# Patient Record
Sex: Male | Born: 1935 | Race: White | Hispanic: No | Smoking: Former smoker
Health system: Southern US, Community
[De-identification: ages and names within clinical notes are randomized; demographics above are authoritative.]

## PROBLEM LIST (undated history)

## (undated) DIAGNOSIS — N183 Chronic kidney disease, stage 3 unspecified: Secondary | ICD-10-CM

## (undated) DIAGNOSIS — M1990 Unspecified osteoarthritis, unspecified site: Secondary | ICD-10-CM

## (undated) DIAGNOSIS — I10 Essential (primary) hypertension: Secondary | ICD-10-CM

## (undated) DIAGNOSIS — N200 Calculus of kidney: Secondary | ICD-10-CM

## (undated) DIAGNOSIS — G309 Alzheimer's disease, unspecified: Secondary | ICD-10-CM

## (undated) DIAGNOSIS — N390 Urinary tract infection, site not specified: Secondary | ICD-10-CM

## (undated) DIAGNOSIS — F0390 Unspecified dementia without behavioral disturbance: Secondary | ICD-10-CM

## (undated) DIAGNOSIS — F0280 Dementia in other diseases classified elsewhere without behavioral disturbance: Secondary | ICD-10-CM

## (undated) DIAGNOSIS — K219 Gastro-esophageal reflux disease without esophagitis: Secondary | ICD-10-CM

## (undated) DIAGNOSIS — E785 Hyperlipidemia, unspecified: Secondary | ICD-10-CM

---

## 1997-10-24 ENCOUNTER — Other Ambulatory Visit: Admission: RE | Admit: 1997-10-24 | Discharge: 1997-10-24 | Payer: Self-pay | Admitting: *Deleted

## 1997-11-18 ENCOUNTER — Other Ambulatory Visit: Admission: RE | Admit: 1997-11-18 | Discharge: 1997-11-18 | Payer: Self-pay | Admitting: Urology

## 1999-01-16 ENCOUNTER — Other Ambulatory Visit: Admission: RE | Admit: 1999-01-16 | Discharge: 1999-01-16 | Payer: Self-pay | Admitting: Gastroenterology

## 1999-01-16 ENCOUNTER — Encounter (INDEPENDENT_AMBULATORY_CARE_PROVIDER_SITE_OTHER): Payer: Self-pay | Admitting: Specialist

## 1999-10-29 ENCOUNTER — Ambulatory Visit (HOSPITAL_COMMUNITY): Admission: RE | Admit: 1999-10-29 | Discharge: 1999-10-29 | Payer: Self-pay | Admitting: Cardiology

## 1999-11-06 ENCOUNTER — Ambulatory Visit (HOSPITAL_COMMUNITY): Admission: RE | Admit: 1999-11-06 | Discharge: 1999-11-06 | Payer: Self-pay | Admitting: Urology

## 1999-11-06 ENCOUNTER — Encounter: Payer: Self-pay | Admitting: Urology

## 1999-11-06 ENCOUNTER — Encounter (INDEPENDENT_AMBULATORY_CARE_PROVIDER_SITE_OTHER): Payer: Self-pay | Admitting: Specialist

## 1999-11-27 ENCOUNTER — Emergency Department (HOSPITAL_COMMUNITY): Admission: EM | Admit: 1999-11-27 | Discharge: 1999-11-27 | Payer: Self-pay

## 2001-02-16 ENCOUNTER — Encounter: Admission: RE | Admit: 2001-02-16 | Discharge: 2001-02-23 | Payer: Self-pay | Admitting: Internal Medicine

## 2002-02-05 ENCOUNTER — Encounter: Payer: Self-pay | Admitting: Urology

## 2002-02-05 ENCOUNTER — Ambulatory Visit (HOSPITAL_BASED_OUTPATIENT_CLINIC_OR_DEPARTMENT_OTHER): Admission: RE | Admit: 2002-02-05 | Discharge: 2002-02-05 | Payer: Self-pay | Admitting: Urology

## 2002-11-28 ENCOUNTER — Encounter: Admission: RE | Admit: 2002-11-28 | Discharge: 2002-11-28 | Payer: Self-pay | Admitting: Urology

## 2002-11-28 ENCOUNTER — Encounter: Payer: Self-pay | Admitting: Urology

## 2002-12-03 ENCOUNTER — Ambulatory Visit (HOSPITAL_BASED_OUTPATIENT_CLINIC_OR_DEPARTMENT_OTHER): Admission: RE | Admit: 2002-12-03 | Discharge: 2002-12-03 | Payer: Self-pay | Admitting: Urology

## 2003-08-15 ENCOUNTER — Encounter (INDEPENDENT_AMBULATORY_CARE_PROVIDER_SITE_OTHER): Payer: Self-pay | Admitting: *Deleted

## 2003-08-23 ENCOUNTER — Ambulatory Visit (HOSPITAL_BASED_OUTPATIENT_CLINIC_OR_DEPARTMENT_OTHER): Admission: RE | Admit: 2003-08-23 | Discharge: 2003-08-23 | Payer: Self-pay | Admitting: Urology

## 2004-01-22 ENCOUNTER — Encounter: Admission: RE | Admit: 2004-01-22 | Discharge: 2004-01-22 | Payer: Self-pay | Admitting: Internal Medicine

## 2004-01-28 ENCOUNTER — Encounter: Admission: RE | Admit: 2004-01-28 | Discharge: 2004-03-02 | Payer: Self-pay | Admitting: Internal Medicine

## 2004-06-19 ENCOUNTER — Ambulatory Visit (HOSPITAL_COMMUNITY): Admission: RE | Admit: 2004-06-19 | Discharge: 2004-06-19 | Payer: Self-pay | Admitting: Orthopedic Surgery

## 2004-06-23 ENCOUNTER — Ambulatory Visit: Payer: Self-pay | Admitting: Internal Medicine

## 2004-08-07 ENCOUNTER — Ambulatory Visit: Payer: Self-pay | Admitting: Family Medicine

## 2004-08-28 ENCOUNTER — Ambulatory Visit: Payer: Self-pay | Admitting: Internal Medicine

## 2004-08-29 ENCOUNTER — Inpatient Hospital Stay (HOSPITAL_COMMUNITY): Admission: EM | Admit: 2004-08-29 | Discharge: 2004-08-31 | Payer: Self-pay | Admitting: *Deleted

## 2004-09-03 ENCOUNTER — Ambulatory Visit: Payer: Self-pay | Admitting: Internal Medicine

## 2004-09-08 ENCOUNTER — Ambulatory Visit: Payer: Self-pay | Admitting: Internal Medicine

## 2004-09-25 ENCOUNTER — Ambulatory Visit: Payer: Self-pay | Admitting: Internal Medicine

## 2004-10-09 ENCOUNTER — Ambulatory Visit: Payer: Self-pay | Admitting: Internal Medicine

## 2004-12-10 ENCOUNTER — Ambulatory Visit: Payer: Self-pay | Admitting: Internal Medicine

## 2004-12-10 ENCOUNTER — Ambulatory Visit (HOSPITAL_COMMUNITY): Admission: RE | Admit: 2004-12-10 | Discharge: 2004-12-10 | Payer: Self-pay | Admitting: Internal Medicine

## 2004-12-15 ENCOUNTER — Ambulatory Visit: Payer: Self-pay | Admitting: Internal Medicine

## 2004-12-28 ENCOUNTER — Ambulatory Visit: Payer: Self-pay | Admitting: Internal Medicine

## 2005-01-05 ENCOUNTER — Encounter: Admission: RE | Admit: 2005-01-05 | Discharge: 2005-01-05 | Payer: Self-pay | Admitting: Internal Medicine

## 2005-01-29 ENCOUNTER — Ambulatory Visit: Payer: Self-pay | Admitting: Internal Medicine

## 2005-02-09 ENCOUNTER — Ambulatory Visit: Payer: Self-pay | Admitting: Internal Medicine

## 2005-10-29 ENCOUNTER — Ambulatory Visit: Payer: Self-pay | Admitting: Gastroenterology

## 2005-11-09 ENCOUNTER — Ambulatory Visit: Payer: Self-pay | Admitting: Gastroenterology

## 2007-07-06 ENCOUNTER — Observation Stay (HOSPITAL_COMMUNITY): Admission: EM | Admit: 2007-07-06 | Discharge: 2007-07-08 | Payer: Self-pay | Admitting: *Deleted

## 2008-06-02 ENCOUNTER — Inpatient Hospital Stay (HOSPITAL_COMMUNITY): Admission: EM | Admit: 2008-06-02 | Discharge: 2008-06-04 | Payer: Self-pay | Admitting: Internal Medicine

## 2008-06-02 ENCOUNTER — Ambulatory Visit: Payer: Self-pay | Admitting: Internal Medicine

## 2008-06-02 ENCOUNTER — Encounter: Payer: Self-pay | Admitting: Emergency Medicine

## 2008-11-03 ENCOUNTER — Emergency Department (HOSPITAL_BASED_OUTPATIENT_CLINIC_OR_DEPARTMENT_OTHER): Admission: EM | Admit: 2008-11-03 | Discharge: 2008-11-03 | Payer: Self-pay | Admitting: Emergency Medicine

## 2009-02-06 ENCOUNTER — Inpatient Hospital Stay (HOSPITAL_COMMUNITY): Admission: EM | Admit: 2009-02-06 | Discharge: 2009-02-09 | Payer: Self-pay | Admitting: Emergency Medicine

## 2009-03-06 ENCOUNTER — Ambulatory Visit (HOSPITAL_COMMUNITY): Admission: RE | Admit: 2009-03-06 | Discharge: 2009-03-06 | Payer: Self-pay | Admitting: Gastroenterology

## 2009-08-08 ENCOUNTER — Emergency Department (HOSPITAL_BASED_OUTPATIENT_CLINIC_OR_DEPARTMENT_OTHER): Admission: EM | Admit: 2009-08-08 | Discharge: 2009-08-08 | Payer: Self-pay | Admitting: Emergency Medicine

## 2009-09-04 ENCOUNTER — Emergency Department (HOSPITAL_BASED_OUTPATIENT_CLINIC_OR_DEPARTMENT_OTHER): Admission: EM | Admit: 2009-09-04 | Discharge: 2009-09-04 | Payer: Self-pay | Admitting: Emergency Medicine

## 2009-09-04 ENCOUNTER — Ambulatory Visit: Payer: Self-pay | Admitting: Diagnostic Radiology

## 2009-09-05 ENCOUNTER — Inpatient Hospital Stay (HOSPITAL_COMMUNITY): Admission: EM | Admit: 2009-09-05 | Discharge: 2009-09-10 | Payer: Self-pay | Admitting: Emergency Medicine

## 2009-09-22 ENCOUNTER — Inpatient Hospital Stay (HOSPITAL_COMMUNITY): Admission: EM | Admit: 2009-09-22 | Discharge: 2009-09-29 | Payer: Self-pay | Admitting: Emergency Medicine

## 2009-09-22 ENCOUNTER — Ambulatory Visit (HOSPITAL_COMMUNITY): Admission: RE | Admit: 2009-09-22 | Discharge: 2009-09-22 | Payer: Self-pay | Admitting: Urology

## 2009-09-29 ENCOUNTER — Ambulatory Visit: Payer: Self-pay | Admitting: Physical Medicine & Rehabilitation

## 2010-08-09 ENCOUNTER — Encounter: Payer: Self-pay | Admitting: Internal Medicine

## 2010-09-05 ENCOUNTER — Telehealth: Payer: Self-pay | Admitting: Internal Medicine

## 2010-09-07 ENCOUNTER — Emergency Department (HOSPITAL_BASED_OUTPATIENT_CLINIC_OR_DEPARTMENT_OTHER): Payer: MEDICARE

## 2010-09-07 ENCOUNTER — Emergency Department (HOSPITAL_BASED_OUTPATIENT_CLINIC_OR_DEPARTMENT_OTHER)
Admission: EM | Admit: 2010-09-07 | Discharge: 2010-09-07 | Disposition: A | Discharge status: Home / Self Care | Payer: MEDICARE | Attending: Emergency Medicine | Admitting: Emergency Medicine

## 2010-09-07 DIAGNOSIS — R5383 Other fatigue: Secondary | ICD-10-CM

## 2010-09-07 DIAGNOSIS — R5381 Other malaise: Secondary | ICD-10-CM

## 2010-09-07 DIAGNOSIS — G319 Degenerative disease of nervous system, unspecified: Secondary | ICD-10-CM | POA: Insufficient documentation

## 2010-09-07 DIAGNOSIS — E039 Hypothyroidism, unspecified: Secondary | ICD-10-CM | POA: Insufficient documentation

## 2010-09-07 DIAGNOSIS — Z79899 Other long term (current) drug therapy: Secondary | ICD-10-CM | POA: Insufficient documentation

## 2010-09-07 DIAGNOSIS — N390 Urinary tract infection, site not specified: Secondary | ICD-10-CM | POA: Insufficient documentation

## 2010-09-07 DIAGNOSIS — K219 Gastro-esophageal reflux disease without esophagitis: Secondary | ICD-10-CM | POA: Insufficient documentation

## 2010-09-07 DIAGNOSIS — R4789 Other speech disturbances: Secondary | ICD-10-CM

## 2010-09-07 DIAGNOSIS — I10 Essential (primary) hypertension: Secondary | ICD-10-CM | POA: Insufficient documentation

## 2010-09-07 LAB — LACTIC ACID, PLASMA: Lactic Acid, Venous: 1.2 mmol/L (ref 0.5–2.2)

## 2010-09-07 LAB — DIFFERENTIAL
Basophils Relative: 0 % (ref 0–1)
Lymphocytes Relative: 17 % (ref 12–46)
Lymphs Abs: 1.4 10*3/uL (ref 0.7–4.0)
Monocytes Absolute: 0.7 10*3/uL (ref 0.1–1.0)
Monocytes Relative: 9 % (ref 3–12)
Neutro Abs: 5.9 10*3/uL (ref 1.7–7.7)
Neutrophils Relative %: 73 % (ref 43–77)

## 2010-09-07 LAB — COMPREHENSIVE METABOLIC PANEL
AST: 35 U/L (ref 0–37)
Albumin: 4.1 g/dL (ref 3.5–5.2)
Alkaline Phosphatase: 76 U/L (ref 39–117)
BUN: 22 mg/dL (ref 6–23)
Creatinine, Ser: 1.5 mg/dL (ref 0.4–1.5)
GFR calc Af Amer: 54 mL/min — ABNORMAL LOW (ref >60)
Potassium: 3.3 mEq/L — ABNORMAL LOW (ref 3.5–5.1)
Total Protein: 7.3 g/dL (ref 6.0–8.3)

## 2010-09-07 LAB — URINE MICROSCOPIC-ADD ON

## 2010-09-07 LAB — URINALYSIS, ROUTINE W REFLEX MICROSCOPIC
Bilirubin Urine: NEGATIVE
Ketones, ur: NEGATIVE mg/dL
Nitrite: POSITIVE — AB
Protein, ur: 30 mg/dL — AB
Urobilinogen, UA: 0.2 mg/dL (ref 0.0–1.0)

## 2010-09-07 LAB — CBC
HCT: 43.1 % (ref 39.0–52.0)
Hemoglobin: 15.3 g/dL (ref 13.0–17.0)
MCH: 32 pg (ref 26.0–34.0)
MCHC: 35.5 g/dL (ref 30.0–36.0)
RBC: 4.78 MIL/uL (ref 4.22–5.81)

## 2010-09-07 LAB — TSH: TSH: 3.399 u[IU]/mL (ref 0.350–4.500)

## 2010-09-07 LAB — PROTIME-INR
INR: 1.03 (ref 0.00–1.49)
Prothrombin Time: 13.7 seconds (ref 11.6–15.2)

## 2010-09-07 LAB — POCT CARDIAC MARKERS
CKMB, poc: 2.5 ng/mL (ref 1.0–8.0)
Myoglobin, poc: 174 ng/mL (ref 12–200)
Troponin i, poc: <0.05 ng/mL (ref 0.00–0.09)

## 2010-09-10 LAB — URINE CULTURE

## 2010-09-14 LAB — CULTURE, BLOOD (ROUTINE X 2)
Culture  Setup Time: 201202210018
Culture: NO GROWTH

## 2010-09-15 NOTE — Procedures (Signed)
Summary: Colonoscopy- Dr Corinda Gubler    Colonoscopy  Procedure date:  08/15/2003  Findings:      Location:  [facility].    Patient Name: [name], [name] MRN:  Procedure Procedures: Colonoscopy CPT: 11914.  Personnel: Endoscopist: Ulyess Mort, MD.  Exam Location: Exam performed in Outpatient Clinic. Outpatient  Patient Consent: Procedure, Alternatives, Risks and Benefits discussed, consent obtained, from patient. Consent was obtained by the RN.  Indications  Surveillance of: Adenomatous Polyp(s).  History  Current Medications: Patient is not currently taking Coumadin.  Pre-Exam Physical: Entire physical exam was normal.  Exam Exam: Extent of exam reached: Cecum, extent intended: Cecum.  The cecum was identified by appendiceal orifice and IC valve. Colon retroflexion performed. Images were not taken. ASA Classification: II. Tolerance: good.  Monitoring: Pulse and BP monitoring, Oximetry used. Supplemental O2 given.  Colon Prep Prep results: good.  Sedation Meds: Patient assessed and found to be appropriate for moderate (conscious) sedation. Fentanyl 75 mcg. given IV. Versed 7.5 mg. given IV.  Findings - DIVERTICULOSIS: Transverse Colon to Sigmoid Colon. ICD9: Diverticulosis: 562.10. Comments: mild.  POLYP: Rectum, Maximum size: 6 mm. sessile polyp. Procedure:  snare with cautery, removed, retrieved, Polyp sent to pathology. ICD9: Colon Polyps: 211.3.   Assessment Abnormal examination, see findings above.  Diagnoses: 211.3: Colon Polyps.  562.10: Diverticulosis.   Events  Unplanned Interventions: No intervention was required.  Unplanned Events: There were no complications. Plans Medication Plan: Await pathology. Continue current medications.  Patient Education: Patient given standard instructions for: Polyps. Diverticulosis. Yearly hemoccult testing recommended. Patient instructed to get routine colonoscopy every 4 years.    Disposition: After procedure patient sent to recovery. After recovery patient sent home.    This report was created from the original endoscopy report, which was reviewed and signed by the above listed endoscopist.

## 2010-09-15 NOTE — Progress Notes (Signed)
Summary: ? repeat colonoscopy   Phone Note Other Incoming   Caller: Dr. Barbaraann Barthel [phone] Summary of Call: She left a message ? if he needs a repeat colonoscopy at age 77 - seems unlikely but please pull chart (paper) Iva Boop MD, [facility]  September 05, 2010 10:20 AM   Follow-up for Phone Call        this is Dr Lamar Sprinkles patient.  According to [facility] he was due for a colon in 07-2007.  I have copied and pasted in his only Cori* report.  The Path report reads "Rectum bx: Skeletal muscle fragment consistent with food matter,  No Colonic tissue identified."  I have sent it down to be stat scanned. Follow-up by: Darcey Nora RN, CGRN,  September 07, 2010 9:45 AM  Additional Follow-up for Phone Call Additional follow up Details #1::        Discussed with Dr Marina Goodell he has actually spoke with [facility] [employer] today on this no recall needed.  Additional Follow-up by: Darcey Nora RN, CGRN,  September 07, 2010 9:47 AM

## 2010-10-04 LAB — COMPREHENSIVE METABOLIC PANEL
Albumin: 4.1 g/dL (ref 3.5–5.2)
BUN: 21 mg/dL (ref 6–23)
Chloride: 101 mEq/L (ref 96–112)
Creatinine, Ser: 1.6 mg/dL — ABNORMAL HIGH (ref 0.4–1.5)
GFR calc non Af Amer: 41 mL/min — ABNORMAL LOW (ref >60)
Total Bilirubin: 0.7 mg/dL (ref 0.3–1.2)

## 2010-10-04 LAB — CBC
HCT: 42.5 % (ref 39.0–52.0)
MCV: 91.3 fL (ref 78.0–100.0)
Platelets: 204 10*3/uL (ref 150–400)
WBC: 11.3 10*3/uL — ABNORMAL HIGH (ref 4.0–10.5)

## 2010-10-04 LAB — DIFFERENTIAL
Basophils Absolute: 0.2 10*3/uL — ABNORMAL HIGH (ref 0.0–0.1)
Lymphocytes Relative: 9 % — ABNORMAL LOW (ref 12–46)
Monocytes Absolute: 0.7 10*3/uL (ref 0.1–1.0)
Neutro Abs: 9.3 10*3/uL — ABNORMAL HIGH (ref 1.7–7.7)

## 2010-10-04 LAB — URINALYSIS, ROUTINE W REFLEX MICROSCOPIC
Bilirubin Urine: NEGATIVE
Glucose, UA: NEGATIVE mg/dL
Nitrite: NEGATIVE
Specific Gravity, Urine: 1.02 (ref 1.005–1.030)
pH: 5.5 (ref 5.0–8.0)

## 2010-10-04 LAB — URINE MICROSCOPIC-ADD ON

## 2010-10-04 LAB — URINE CULTURE

## 2010-10-07 LAB — CBC
Hemoglobin: 11.3 g/dL — ABNORMAL LOW (ref 13.0–17.0)
Hemoglobin: 11.8 g/dL — ABNORMAL LOW (ref 13.0–17.0)
MCHC: 34.8 g/dL (ref 30.0–36.0)
MCHC: 35 g/dL (ref 30.0–36.0)
MCHC: 35.1 g/dL (ref 30.0–36.0)
MCHC: 35.4 g/dL (ref 30.0–36.0)
MCV: 90.3 fL (ref 78.0–100.0)
MCV: 90.8 fL (ref 78.0–100.0)
Platelets: 157 10*3/uL (ref 150–400)
Platelets: 178 10*3/uL (ref 150–400)
Platelets: 213 10*3/uL (ref 150–400)
RBC: 3.45 MIL/uL — ABNORMAL LOW (ref 4.22–5.81)
RBC: 3.61 MIL/uL — ABNORMAL LOW (ref 4.22–5.81)
RDW: 13.7 % (ref 11.5–15.5)
RDW: 13.8 % (ref 11.5–15.5)
WBC: 5.2 10*3/uL (ref 4.0–10.5)
WBC: 7.2 10*3/uL (ref 4.0–10.5)

## 2010-10-07 LAB — BASIC METABOLIC PANEL
BUN: 20 mg/dL (ref 6–23)
CO2: 25 mEq/L (ref 19–32)
Calcium: 8.3 mg/dL — ABNORMAL LOW (ref 8.4–10.5)
Calcium: 8.5 mg/dL (ref 8.4–10.5)
Calcium: 8.6 mg/dL (ref 8.4–10.5)
Chloride: 102 mEq/L (ref 96–112)
Chloride: 103 mEq/L (ref 96–112)
Chloride: 104 mEq/L (ref 96–112)
Chloride: 105 mEq/L (ref 96–112)
Chloride: 105 mEq/L (ref 96–112)
Creatinine, Ser: 1.47 mg/dL (ref 0.4–1.5)
Creatinine, Ser: 1.77 mg/dL — ABNORMAL HIGH (ref 0.4–1.5)
GFR calc Af Amer: 41 mL/min — ABNORMAL LOW (ref >60)
GFR calc Af Amer: 55 mL/min — ABNORMAL LOW (ref >60)
GFR calc Af Amer: >60 mL/min (ref >60)
GFR calc Af Amer: >60 mL/min (ref >60)
Potassium: 2.8 mEq/L — ABNORMAL LOW (ref 3.5–5.1)
Potassium: 2.9 mEq/L — ABNORMAL LOW (ref 3.5–5.1)
Potassium: 3.3 mEq/L — ABNORMAL LOW (ref 3.5–5.1)
Sodium: 133 mEq/L — ABNORMAL LOW (ref 135–145)
Sodium: 135 mEq/L (ref 135–145)
Sodium: 136 mEq/L (ref 135–145)
Sodium: 136 mEq/L (ref 135–145)

## 2010-10-07 LAB — DIFFERENTIAL
Basophils Absolute: 0 10*3/uL (ref 0.0–0.1)
Basophils Relative: 0 % (ref 0–1)
Basophils Relative: 0 % (ref 0–1)
Eosinophils Absolute: 0 10*3/uL (ref 0.0–0.7)
Eosinophils Relative: 0 % (ref 0–5)
Lymphocytes Relative: 1 % — ABNORMAL LOW (ref 12–46)
Lymphs Abs: 0.5 10*3/uL — ABNORMAL LOW (ref 0.7–4.0)
Monocytes Absolute: 0.5 10*3/uL (ref 0.1–1.0)
Monocytes Relative: 6 % (ref 3–12)
Neutro Abs: 11.2 10*3/uL — ABNORMAL HIGH (ref 1.7–7.7)
Neutrophils Relative %: 87 % — ABNORMAL HIGH (ref 43–77)
Neutrophils Relative %: 95 % — ABNORMAL HIGH (ref 43–77)

## 2010-10-07 LAB — CULTURE, BLOOD (ROUTINE X 2): Culture: NO GROWTH

## 2010-10-07 LAB — URINALYSIS, ROUTINE W REFLEX MICROSCOPIC
Bilirubin Urine: NEGATIVE
Nitrite: NEGATIVE
Specific Gravity, Urine: 1.021 (ref 1.005–1.030)
Urobilinogen, UA: 0.2 mg/dL (ref 0.0–1.0)
pH: 5 (ref 5.0–8.0)

## 2010-10-07 LAB — COMPREHENSIVE METABOLIC PANEL
ALT: 19 U/L (ref 0–53)
AST: 24 U/L (ref 0–37)
Albumin: 3.3 g/dL — ABNORMAL LOW (ref 3.5–5.2)
Alkaline Phosphatase: 52 U/L (ref 39–117)
Calcium: 8.7 mg/dL (ref 8.4–10.5)
GFR calc Af Amer: 40 mL/min — ABNORMAL LOW (ref >60)
Potassium: 3.1 mEq/L — ABNORMAL LOW (ref 3.5–5.1)
Sodium: 136 mEq/L (ref 135–145)
Total Protein: 6.1 g/dL (ref 6.0–8.3)

## 2010-10-07 LAB — URINE MICROSCOPIC-ADD ON

## 2010-10-07 LAB — POCT CARDIAC MARKERS
CKMB, poc: <1 ng/mL — ABNORMAL LOW (ref 1.0–8.0)
Myoglobin, poc: 224 ng/mL (ref 12–200)
Troponin i, poc: <0.05 ng/mL (ref 0.00–0.09)

## 2010-10-07 LAB — URINE CULTURE

## 2010-10-07 LAB — CREATININE, URINE, RANDOM: Creatinine, Urine: 158.3 mg/dL

## 2010-10-07 LAB — MAGNESIUM: Magnesium: 1.7 mg/dL (ref 1.5–2.5)

## 2010-10-09 LAB — URINALYSIS, MICROSCOPIC ONLY
Bilirubin Urine: NEGATIVE
Glucose, UA: NEGATIVE mg/dL
Ketones, ur: NEGATIVE mg/dL
Nitrite: NEGATIVE
Protein, ur: 30 mg/dL — AB
Specific Gravity, Urine: 1.015 (ref 1.005–1.030)
Urobilinogen, UA: 1 mg/dL (ref 0.0–1.0)
pH: 6.5 (ref 5.0–8.0)

## 2010-10-09 LAB — CBC
HCT: 33.4 % — ABNORMAL LOW (ref 39.0–52.0)
HCT: 33.6 % — ABNORMAL LOW (ref 39.0–52.0)
Hemoglobin: 10.9 g/dL — ABNORMAL LOW (ref 13.0–17.0)
Hemoglobin: 11.1 g/dL — ABNORMAL LOW (ref 13.0–17.0)
MCHC: 32.8 g/dL (ref 30.0–36.0)
MCHC: 33.1 g/dL (ref 30.0–36.0)
MCV: 90.3 fL (ref 78.0–100.0)
MCV: 91.4 fL (ref 78.0–100.0)
MCV: 91.5 fL (ref 78.0–100.0)
Platelets: 240 10*3/uL (ref 150–400)
Platelets: 276 10*3/uL (ref 150–400)
RBC: 3.67 MIL/uL — ABNORMAL LOW (ref 4.22–5.81)
RBC: 3.7 MIL/uL — ABNORMAL LOW (ref 4.22–5.81)
RBC: 3.78 MIL/uL — ABNORMAL LOW (ref 4.22–5.81)
RDW: 14.3 % (ref 11.5–15.5)
RDW: 14.6 % (ref 11.5–15.5)
WBC: 7 10*3/uL (ref 4.0–10.5)
WBC: 8.4 10*3/uL (ref 4.0–10.5)
WBC: 8.5 10*3/uL (ref 4.0–10.5)

## 2010-10-09 LAB — COMPREHENSIVE METABOLIC PANEL
ALT: 125 U/L — ABNORMAL HIGH (ref 0–53)
ALT: 19 U/L (ref 0–53)
AST: 18 U/L (ref 0–37)
AST: 82 U/L — ABNORMAL HIGH (ref 0–37)
Albumin: 3.2 g/dL — ABNORMAL LOW (ref 3.5–5.2)
Alkaline Phosphatase: 121 U/L — ABNORMAL HIGH (ref 39–117)
Alkaline Phosphatase: 58 U/L (ref 39–117)
BUN: 9 mg/dL (ref 6–23)
CO2: 23 mEq/L (ref 19–32)
CO2: 24 mEq/L (ref 19–32)
Calcium: 8.5 mg/dL (ref 8.4–10.5)
Chloride: 107 mEq/L (ref 96–112)
Chloride: 108 mEq/L (ref 96–112)
Creatinine, Ser: 1.37 mg/dL (ref 0.4–1.5)
GFR calc Af Amer: 60 mL/min — ABNORMAL LOW (ref >60)
GFR calc Af Amer: >60 mL/min (ref >60)
GFR calc non Af Amer: 49 mL/min — ABNORMAL LOW (ref >60)
GFR calc non Af Amer: 53 mL/min — ABNORMAL LOW (ref >60)
Glucose, Bld: 125 mg/dL — ABNORMAL HIGH (ref 70–99)
Potassium: 2.8 mEq/L — ABNORMAL LOW (ref 3.5–5.1)
Potassium: 4 mEq/L (ref 3.5–5.1)
Sodium: 135 mEq/L (ref 135–145)
Sodium: 138 mEq/L (ref 135–145)
Total Bilirubin: 0.9 mg/dL (ref 0.3–1.2)
Total Bilirubin: 1 mg/dL (ref 0.3–1.2)
Total Protein: 6 g/dL (ref 6.0–8.3)

## 2010-10-09 LAB — CARDIAC PANEL(CRET KIN+CKTOT+MB+TROPI)
CK, MB: 1.5 ng/mL (ref 0.3–4.0)
CK, MB: 2.3 ng/mL (ref 0.3–4.0)
Relative Index: INVALID (ref 0.0–2.5)
Total CK: 41 U/L (ref 7–232)
Total CK: 44 U/L (ref 7–232)
Troponin I: 0.02 ng/mL (ref 0.00–0.06)
Troponin I: <0.01 ng/mL (ref 0.00–0.06)

## 2010-10-09 LAB — BASIC METABOLIC PANEL
BUN: 10 mg/dL (ref 6–23)
BUN: 8 mg/dL (ref 6–23)
CO2: 25 mEq/L (ref 19–32)
CO2: 28 mEq/L (ref 19–32)
Calcium: 8.7 mg/dL (ref 8.4–10.5)
Calcium: 8.7 mg/dL (ref 8.4–10.5)
Chloride: 106 mEq/L (ref 96–112)
Chloride: 109 mEq/L (ref 96–112)
Creatinine, Ser: 1.25 mg/dL (ref 0.4–1.5)
Creatinine, Ser: 1.37 mg/dL (ref 0.4–1.5)
GFR calc Af Amer: 60 mL/min — ABNORMAL LOW (ref >60)
GFR calc Af Amer: >60 mL/min (ref >60)
GFR calc non Af Amer: 49 mL/min — ABNORMAL LOW (ref >60)
GFR calc non Af Amer: 55 mL/min — ABNORMAL LOW (ref >60)
Glucose, Bld: 126 mg/dL — ABNORMAL HIGH (ref 70–99)
Glucose, Bld: 159 mg/dL — ABNORMAL HIGH (ref 70–99)
Potassium: 2.9 mEq/L — ABNORMAL LOW (ref 3.5–5.1)
Potassium: 3.9 mEq/L (ref 3.5–5.1)
Sodium: 135 mEq/L (ref 135–145)
Sodium: 139 mEq/L (ref 135–145)

## 2010-10-09 LAB — CULTURE, BLOOD (ROUTINE X 2)
Culture: NO GROWTH
Culture: NO GROWTH

## 2010-10-09 LAB — URINE CULTURE
Colony Count: NO GROWTH
Colony Count: NO GROWTH
Culture: NO GROWTH
Culture: NO GROWTH
Special Requests: NEGATIVE
Special Requests: NEGATIVE

## 2010-10-09 LAB — T3, FREE: T3, Free: 2.3 pg/mL (ref 2.3–4.2)

## 2010-10-09 LAB — HEPATIC FUNCTION PANEL
ALT: 79 U/L — ABNORMAL HIGH (ref 0–53)
AST: 28 U/L (ref 0–37)
Bilirubin, Direct: 0.1 mg/dL (ref 0.0–0.3)
Indirect Bilirubin: 0.6 mg/dL (ref 0.3–0.9)
Total Protein: 6.2 g/dL (ref 6.0–8.3)

## 2010-10-09 LAB — DIFFERENTIAL
Basophils Absolute: 0 10*3/uL (ref 0.0–0.1)
Eosinophils Absolute: 0 10*3/uL (ref 0.0–0.7)
Eosinophils Relative: 0 % (ref 0–5)
Monocytes Absolute: 0.6 10*3/uL (ref 0.1–1.0)

## 2010-10-09 LAB — CULTURE, BLOOD (SINGLE): Culture: NO GROWTH

## 2010-10-09 LAB — BRAIN NATRIURETIC PEPTIDE: Pro B Natriuretic peptide (BNP): 48.9 pg/mL (ref 0.0–100.0)

## 2010-10-09 LAB — MAGNESIUM: Magnesium: 2.1 mg/dL (ref 1.5–2.5)

## 2010-10-25 LAB — CBC
HCT: 42.7 % (ref 39.0–52.0)
Hemoglobin: 14.7 g/dL (ref 13.0–17.0)
Hemoglobin: 16.2 g/dL (ref 13.0–17.0)
MCHC: 35.1 g/dL (ref 30.0–36.0)
MCV: 91.5 fL (ref 78.0–100.0)
RBC: 5.15 MIL/uL (ref 4.22–5.81)
RDW: 13.7 % (ref 11.5–15.5)
WBC: 5.9 10*3/uL (ref 4.0–10.5)
WBC: 8.1 10*3/uL (ref 4.0–10.5)

## 2010-10-25 LAB — DIFFERENTIAL
Basophils Absolute: 0 10*3/uL (ref 0.0–0.1)
Eosinophils Absolute: 0.1 10*3/uL (ref 0.0–0.7)
Eosinophils Relative: 2 % (ref 0–5)
Lymphocytes Relative: 19 % (ref 12–46)
Monocytes Absolute: 1 10*3/uL (ref 0.1–1.0)

## 2010-10-25 LAB — BASIC METABOLIC PANEL
BUN: 14 mg/dL (ref 6–23)
CO2: 21 mEq/L (ref 19–32)
Calcium: 9 mg/dL (ref 8.4–10.5)
Calcium: 9.3 mg/dL (ref 8.4–10.5)
Chloride: 115 mEq/L — ABNORMAL HIGH (ref 96–112)
Creatinine, Ser: 1.15 mg/dL (ref 0.4–1.5)
Creatinine, Ser: 1.18 mg/dL (ref 0.4–1.5)
GFR calc Af Amer: >60 mL/min (ref >60)
GFR calc non Af Amer: 49 mL/min — ABNORMAL LOW (ref >60)
GFR calc non Af Amer: 59 mL/min — ABNORMAL LOW (ref >60)
GFR calc non Af Amer: >60 mL/min (ref >60)
Glucose, Bld: 96 mg/dL (ref 70–99)
Potassium: 3.9 mEq/L (ref 3.5–5.1)
Sodium: 143 mEq/L (ref 135–145)

## 2010-10-25 LAB — FECAL LACTOFERRIN, QUANT: Fecal Lactoferrin: POSITIVE

## 2010-10-25 LAB — COMPREHENSIVE METABOLIC PANEL
ALT: 31 U/L (ref 0–53)
AST: 34 U/L (ref 0–37)
Alkaline Phosphatase: 65 U/L (ref 39–117)
CO2: 22 mEq/L (ref 19–32)
Calcium: 9.8 mg/dL (ref 8.4–10.5)
Chloride: 110 mEq/L (ref 96–112)
GFR calc Af Amer: 54 mL/min — ABNORMAL LOW (ref >60)
GFR calc non Af Amer: 45 mL/min — ABNORMAL LOW (ref >60)
Glucose, Bld: 105 mg/dL — ABNORMAL HIGH (ref 70–99)
Sodium: 139 mEq/L (ref 135–145)
Total Bilirubin: 1.1 mg/dL (ref 0.3–1.2)

## 2010-10-25 LAB — CLOSTRIDIUM DIFFICILE EIA: C difficile Toxins A+B, EIA: NEGATIVE

## 2010-10-25 LAB — STOOL CULTURE

## 2010-10-25 LAB — OVA AND PARASITE EXAMINATION

## 2010-10-25 LAB — MAGNESIUM: Magnesium: 2.2 mg/dL (ref 1.5–2.5)

## 2010-10-27 IMAGING — CR DG ABDOMEN 1V
1 series · 1 of 1 positions shown · non-contrast
Comparison: CT abdomen pelvis 09/07/2009

CLINICAL DATA: Right UPJ stone.  Pre lithotripsy.

ABDOMEN - 1 VIEW

[t abdomen supine]
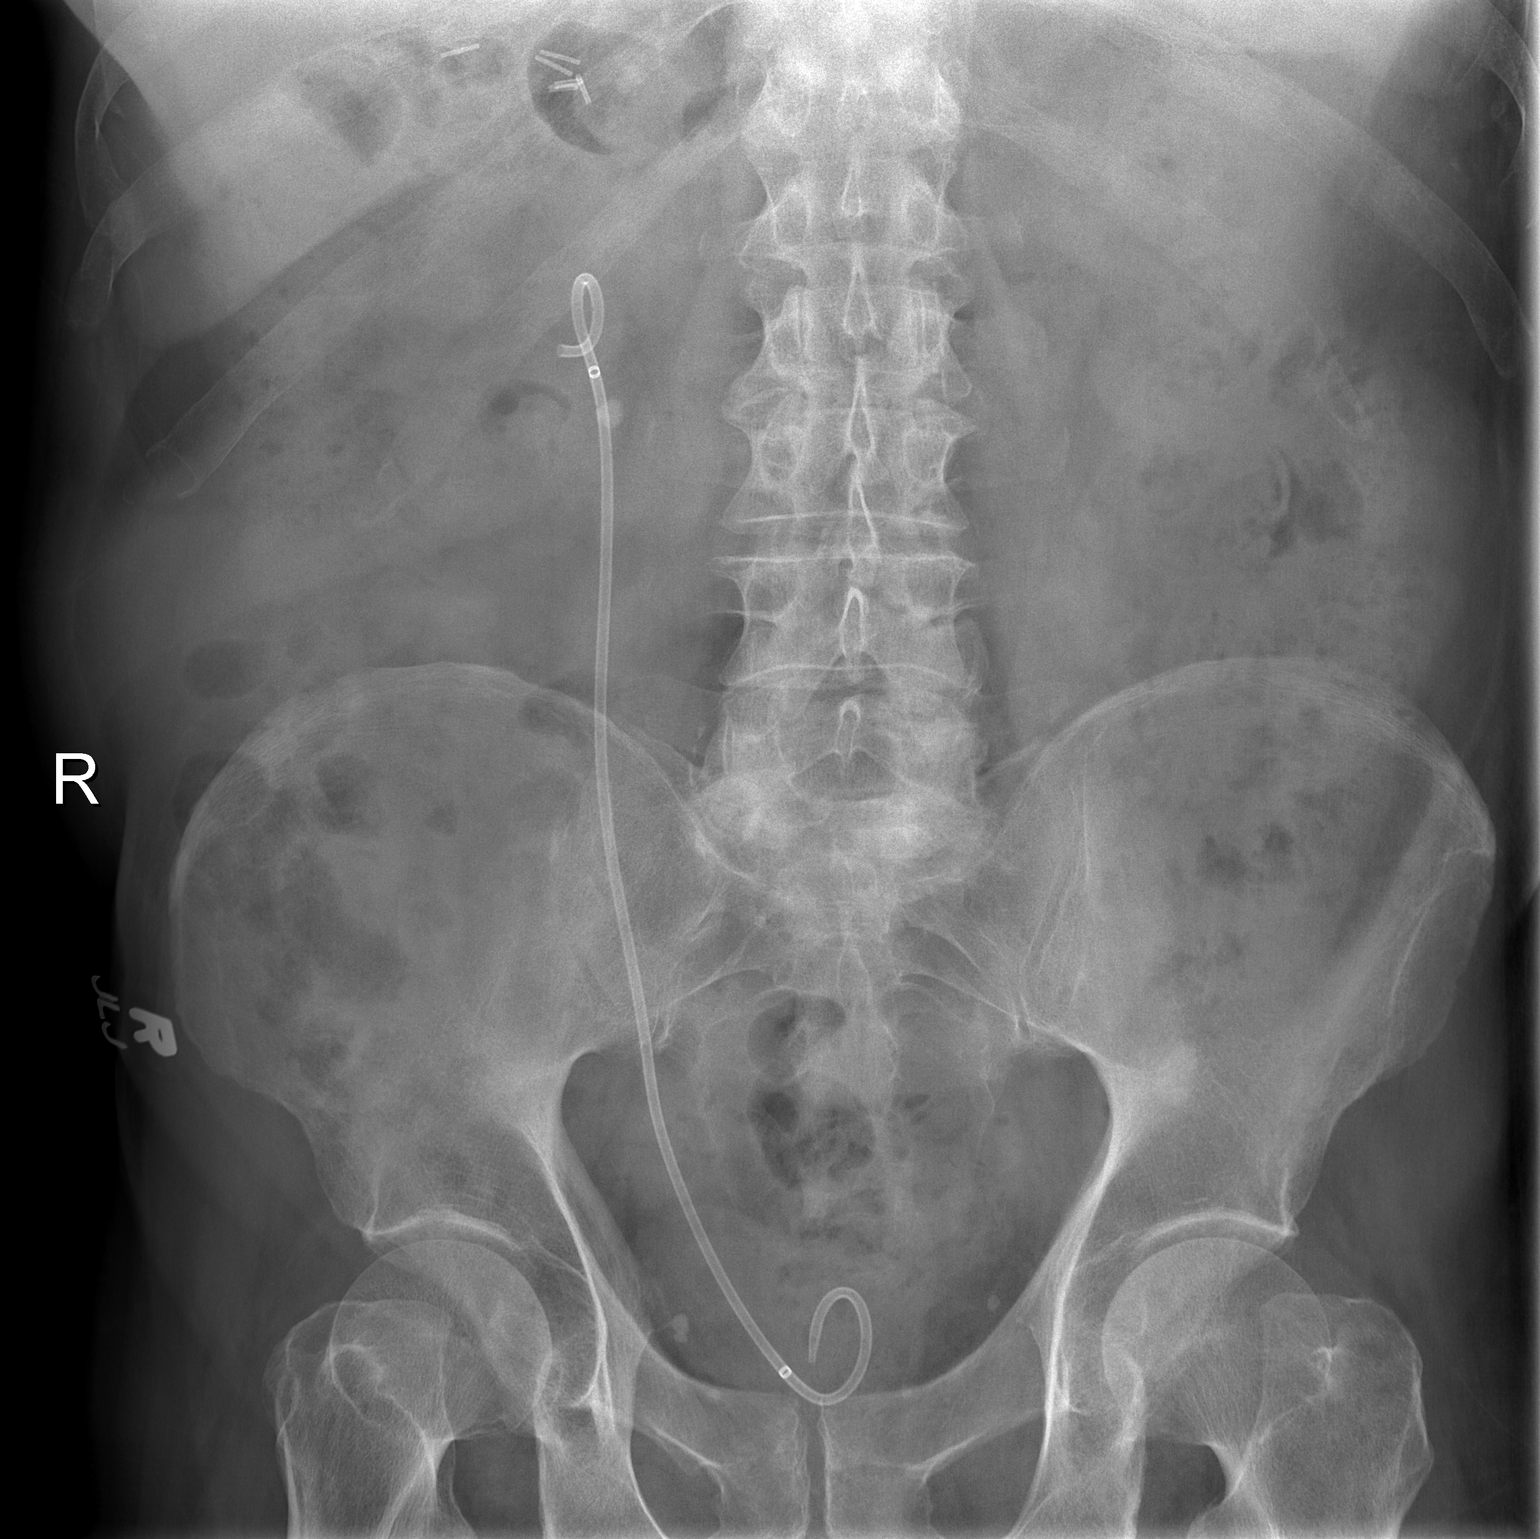

[1 of 1 positions shown; findings below may reference images not displayed]

FINDINGS: A double-J right ureteral stent is in place with the
proximal loop formed in the expected location of the right renal
pelvis, and distal loop formed in the expected location of the
bladder.  A 7 mm stone is seen along the proximal aspect of the
stent, in the expected location of the right ureteral pelvic
junction.  There are phleboliths in the anatomic pelvis.  Bowel gas
pattern is unremarkable.  Surgical clips are seen in the right
upper quadrant.
IMPRESSION: Right ureteral pelvic junction stone with a right ureteral stent in
place.

## 2010-10-28 LAB — URINALYSIS, ROUTINE W REFLEX MICROSCOPIC
Glucose, UA: 250 mg/dL — AB
Protein, ur: 30 mg/dL — AB

## 2010-10-28 LAB — DIFFERENTIAL
Basophils Absolute: 0.2 10*3/uL — ABNORMAL HIGH (ref 0.0–0.1)
Eosinophils Absolute: 0.1 10*3/uL (ref 0.0–0.7)
Eosinophils Relative: 0 % (ref 0–5)

## 2010-10-28 LAB — CBC
HCT: 42.2 % (ref 39.0–52.0)
MCHC: 34.1 g/dL (ref 30.0–36.0)
MCV: 92 fL (ref 78.0–100.0)
Platelets: 259 10*3/uL (ref 150–400)
RDW: 13.4 % (ref 11.5–15.5)

## 2010-10-28 LAB — URINE MICROSCOPIC-ADD ON

## 2010-10-28 LAB — URINE CULTURE

## 2010-10-28 LAB — BASIC METABOLIC PANEL
BUN: 22 mg/dL (ref 6–23)
Chloride: 105 mEq/L (ref 96–112)
GFR calc non Af Amer: 53 mL/min — ABNORMAL LOW (ref >60)
Glucose, Bld: 153 mg/dL — ABNORMAL HIGH (ref 70–99)
Potassium: 3.3 mEq/L — ABNORMAL LOW (ref 3.5–5.1)

## 2010-12-01 NOTE — Discharge Summary (Signed)
NAME:  [name], [name] NO.:  [id number]   MEDICAL RECORD NO.:  [id number]          PATIENT TYPE:  INP   LOCATION:  1508                         FACILITY:  [facility]   PHYSICIAN:  Michelene Gardener, MD    DATE OF BIRTH:  August 12, 1921   DATE OF ADMISSION:  07/05/2007  DATE OF DISCHARGE:  07/08/2007                               DISCHARGE SUMMARY   PRIMARY CARE PHYSICIAN:  Chales Salmon. Abigail Miyamoto, M.D.   DISCHARGE DIAGNOSES:  1. Urinary tract infection.  2. Dehydration.  3. Hypokalemia.  4. Hypertension.  5. Alzheimer's.  6. Gastroesophageal reflux disease.  7. Hypothyroidism.  8. Altered mental status.   DISCHARGE MEDICATIONS:  Ciprofloxacin 500 mg twice daily x2 weeks.   The patient is to continue all medications that include  1. Amitriptyline 10 mg p.o. at bedtime.  2. Aspirin 81 mg p.o. once daily.  3. Multivitamin p.o. once daily.  4. Glucosamine 1 tablet once daily.  5. Mobic 7.5 mg p.o. twice daily.  6. Omeprazole 20 mg p.o. once daily.  7. Synthroid 100 mcg once a day.  8. Toprol XL 25 mg once daily.  9. Hydrochlorothiazide 25 mg once a day.   CONSULTATIONS:  None.   PROCEDURES:  None.   FOLLOWUP APPOINTMENTS:  Dr. Henrine Screws in 1-2 weeks.   RADIOLOGY STUDIES:  1. Chest x-ray on December 17 showed mild cardiomegaly with chronic      bronchitic changes and no acute findings.  2. Abdominal x-ray showed normal gas pattern with 6 mm right upper      quadrant calcification with no other acute problems.   HOSPITAL COURSE:  #1.  ALTERED MENTAL STATUS:  This patient was brought to the hospital  because he had been confused. His confusion is most likely secondary to  urinary tract infection.  This patient was treated with antibiotics, and  mental status was back at baseline by the time of discharge.   #2.  URINARY TRACT INFECTION:  His urinalysis initially and was not  impressive for UTI, but urine and blood cultures were sent.  His urine  culture came to  be positive for has Enterococcus.  At the time of  discharge, the sensitivity is not available. The patient already  received Avelox and responded well.  I will switch him to ciprofloxacin  to take 500 mg IV twice daily and then will follow his sensitivity.  If  his sensitivity shows resistance to Cipro, then will call his office and  switch his antibiotics.   #3.  DEHYDRATION:  That was treated with IV fluids, and this resolved by  the time of discharge.   #4.  HYPOKALEMIA:  The patient was given potassium chloride.   DISPOSITION:  Otherwise other medical conditions remained stable in the  hospital. The patient will be discharged home today.   Time at discharge:  40 minutes.      Michelene Gardener, MD  Electronically Signed     NAE/MEDQ  D:  07/08/2007  T:  07/09/2007  Job:  [id number]   cc:   Chales Salmon. Abigail Miyamoto,  M.D.  Fax: [phone]

## 2010-12-01 NOTE — H&P (Signed)
NAME:  [name], [name] NO.:  [id number]   MEDICAL RECORD NO.:  [id number]          PATIENT TYPE:  INP   LOCATION:  4704                         FACILITY:  [facility]   PHYSICIAN:  Therisa Doyne, MD    DATE OF BIRTH:  1921-10-05   DATE OF ADMISSION:  06/02/2008  DATE OF DISCHARGE:                              HISTORY & PHYSICAL   PRIMARY CARE Tarahji Ramthun:  Chales Salmon. Abigail Miyamoto, M.D.   CHIEF COMPLAINT:  Fall and urinary tract infection.   HISTORY OF PRESENT ILLNESS:  An 77 year old white male with a past  medical history significant for Alzheimer's dementia, hypertension,  hypothyroidism, who presents to the emergency department as a direct  admission for evaluation of a fall.  Today he was in the bathroom and  had a questionable syncopal event.  It was noted that he fell.  He went  to church and was in his normal state of health at that time; however,  throughout the rest of the day, he had progressive confusion and altered  mental status.  Because of that, his family brought him to the emergency  department.  He denies any fever, chills, or back pain.  He does report  mild dysuria.  He denies chest pain, shortness of breath, cough, lower  extremity edema, weight loss or weight gain.   In the emergency department, the patient was found to have a urinary  tract infection and was started on Rocephin.   REVIEW OF SYSTEMS:  All systems were reviewed and are negative except as  mentioned above in the history of present illness.   PAST MEDICAL HISTORY:  1. Alzheimer's dementia.  2. Hypertension.  3. Hypothyroidism.  4. Gastroesophageal reflux disease.  5. Depression.   SOCIAL HISTORY:  Patient lives at home in [place] with his children.  He denies tobacco, alcohol, or drugs.   FAMILY HISTORY:  There is no significant family history that is  pertinent to his illness.   MEDICATIONS:  At this time, patient cannot give me a list of his  medications.  We will have to  contact his children in the morning.   ALLERGIES:  No known drug allergies.   PHYSICAL EXAMINATION:  VITAL SIGNS:  Temperature 98.5, blood pressure  99/72, pulse 81, respirations 20, oxygen saturation 94% on room air.  GENERAL:  He is in no acute distress.  HEENT:  Normocephalic and atraumatic.  Pupils are equal, round and  reactive to light and accommodation.  Extraocular movements are intact.  Oropharynx is pink and moist without any lesion.  NECK:  Supple with no lymphadenopathy.  No jugular venous distention.  No masses.  CARDIOVASCULAR:  Regular rate and rhythm.  No murmurs, rubs or gallops.  CHEST:  Clear to auscultation bilaterally.  ABDOMEN:  Positive bowel sounds.  Soft, nontender, nondistended.  EXTREMITIES:  No clubbing, cyanosis or edema.  Dorsalis pedis pulses are  2+ bilaterally.   PERTINENT LABORATORY VALUES:  White blood cell count 11.2.  Normal renal  function.  Coagulation studies normal.  Urine drug screen shows  tricyclic antidepressants.  Urinalysis shows  21-50 white blood cells.   Chest x-ray:  No acute cardiopulmonary disease.   CT scan of the head:  No acute intracranial abnormalities.   ASSESSMENT/PLAN:  An 77 year old white male with a past medical history  significant for hypertension, hypothyroidism, and mild dementia, who  presents to the emergency department with a fall and a urinary tract  infection.   1. Will admit the patient to the [facility].   1. Urinary tract infection:  He received Rocephin in the emergency      department.  I think this is a reasonable choice.  We will continue      Rocephin 1 gm IV q.24h.  Check urine culture.   1. Altered mental status and confusion: likely secondary to his      urinary tract infection.  Clinically, this is improving.   1. Questionable syncopal event versus fall.  We will monitor the      patient on telemetry.  He had a negative CT scan of the head.  At      this time, he has no focal  neurological deficits.  We will continue      to monitor him with neurological checks.   1. Multiple other medical problems, including hypertension,      hypothyroidism, and also dementia.  It will be necessary to contact      his family members in the morning to get an accurate medication      list.   1. Fluids, electrolytes, nutrition:  Normal saline at 75 cc/hr.      Electrolytes are stable.  Regular diet.   1. Deep venous thrombosis prophylaxis:  Lovenox.      Therisa Doyne, MD  Electronically Signed     SJT/MEDQ  D:  06/03/2008  T:  06/03/2008  Job:  [id number]

## 2010-12-01 NOTE — H&P (Signed)
NAME:  [name], [name] NO.:  [id number]   MEDICAL RECORD NO.:  [id number]          PATIENT TYPE:  EMS   LOCATION:  ED                           FACILITY:  [facility]   PHYSICIAN:  Hollice Espy, M.D.DATE OF BIRTH:  11-11-1921   DATE OF ADMISSION:  07/05/2007  DATE OF DISCHARGE:                              HISTORY & PHYSICAL   PRIMARY CARE PHYSICIAN:  Chales Salmon. Abigail Miyamoto, M.D.   CHIEF COMPLAINT:  Confusion.   HISTORY OF PRESENT ILLNESS:  The patient is an 77 year old white male  with past medical history of hypothyroidism and hypertension who the  family today found him slightly disoriented and noted to be looking  feverish so they brought him into the emergency room.  He had a previous  episode and was hospitalized several years ago for a UTI with secondary  sepsis.  However, when he was brought into the emergency room, he was  noted to have a temperature of 102.5, slightly elevated heart rate of  102, but when labs were checked he was found to have a white count of  13.3 with an 86% shift.  The rest of his labs, however, were completely  unremarkable with the exception of a slightly low potassium at 3, but a  normal BUN and creatinine, a normal liver function tests, and a negative  urinalysis.  A chest x-ray was done which showed chronic bronchitic  changes, but no evidence of any acute infiltrate.  The patient himself  says he is feeling okay.  He says he was feeling a little bit weak  earlier, but since he has been started on IV fluids, he is feeling  better.  He denies any headaches, vision changes, dysphagia, chest pain,  palpitations, shortness of breath, wheezing, or coughing.  No abdominal  pain, no hematuria, dysuria, diarrhea, focal extremity numbness,  weakness, or pain.  When asked about his bowel movements he says he  suffers from chronic constipation, but requiring a daily laxative, he  had a large bowel movement yesterday after taking his laxative.   The  patient appears to be a relatively good historian in terms of his  history.   PAST MEDICAL HISTORY:  Includes arthritis, Alzheimer's although very  minimal at this point, hypothyroidism, hypertension, and depression.   MEDICATIONS:  1. Amitriptyline.  2. Aspirin.  3. Os-Cal.  4. Glucosamine.  5. Mobic.  6. Namenda.  7. Nexium.  8. Synthroid.  9. Toprol.  10.Ultram.  11.Vitamin C.   ALLERGIES:  No known drug allergies.   SOCIAL HISTORY:  No tobacco, alcohol, or drug use.   FAMILY HISTORY:  Noncontributory.   PHYSICAL EXAMINATION:  VITAL SIGNS:  Temperature 102.5 and now 99.6,  heart rate 102, blood pressure 133/77, respirations 18, O2 saturations  95% on room air.  GENERAL:  He is alert and oriented x3 in no acute distress.  HEENT:  Normocephalic and atraumatic.  His mucous membranes are moist.  He has no carotid bruits.  HEART:  Regular rate and rhythm, S1 and S2.  LUNGS:  Clear to auscultation bilaterally.  ABDOMEN:  Soft, nontender, and nondistended with hypoactive bowel  sounds.  EXTREMITIES:  No cyanosis, clubbing, or edema.  He has no evidence of  any cellulitis, ulcerations, or wounds anywhere.   LABORATORY DATA:  UA notes moderate hemoglobin, small leukocyte  esterase, but only 3-6 white and red cells, no bacteria, nitrite  negative.  Sodium 137, potassium 3, chloride 100, bicarb 29, BUN 11,  creatinine 1.2, glucose 124.  LFT's are unremarkable.  White count 13.3,  H&H 15.6 and 44, MCV 91, platelet count 186, with 86% shift.   ASSESSMENT:  1. Confusion.  This is secondary to #2.  2. Dehydration brought on by #3.  3. Infection.  We noted fever and increased white blood cell count.      However, source is unclear.  Chest x-ray and urinalysis are      negative.  He essentially has no real symptoms.  We will hold on      antibiotics for now, check an abdominal x-ray to check for      prolonged constipation and perhaps he has bacteria translocation of       the gut.  We will repeat a CBC and BMET in the morning.  4. Hypertension which is stable.  5. Alzheimer's, very minimal.  Continue Namenda.  6. Gastroesophageal reflux disease, continue Nexium.  7. Hypothyroidism.      Hollice Espy, M.D.  Electronically Signed     SKK/MEDQ  D:  07/06/2007  T:  07/06/2007  Job:  [id number]   cc:   Chales Salmon. Abigail Miyamoto, M.D.  Fax: [phone]

## 2010-12-01 NOTE — Discharge Summary (Signed)
NAME:  [name], [name]                  ACCOUNT NO.:  [id number]   MEDICAL RECORD NO.:  [id number]          PATIENT TYPE:  INP   LOCATION:  1432                         [facility]ACILITY:  [facility]   PHYSICIAN:  Kela Millin, M.D.DATE OF BIRTH:  Dec 17, 1921   DATE OF ADMISSION:  02/06/2009  DATE OF DISCHARGE:  02/09/2009                               DISCHARGE SUMMARY   DISCHARGE DIAGNOSES:  1. Acute-on-chronic diarrhea in setting of recent antibiotic use.      Clostridium difficile negative x2.  2. Hypokalemia, resolved.  3. Esophageal stricture.  The patient to follow up with [facility] in 2-      4 weeks.  4. Hypothyroidism, undertreated.  Synthroid dose increased.  5. Hypertension.  6. History of hypercholesterolemia.  7. History of cerebrovascular accident.  8. History of osteoarthritis.  9. History of dementia.   PROCEDURES AND STUDIES:  Barium esophagram:  Small hiatal hernia and  distal esophageal stricture.  The 13-mm barium tablet became lodged in  the distal esophagus.  Narrowing cannot be fully characterized but is  likely due to inflammatory change.  Nonspecific esophageal dysmotility.   BRIEF HISTORY:  The patient is an 77 year old white male with the above-  listed medical problems who presented with a 2-week history of loose  stools.  He reported that he had had some incontinence secondary to the  diarrhea.  He also reported increasing weakness and a 15-pound weight  loss in the 2 weeks.  His daughter-in-law reported that he had been on  antibiotics in the past month for UTI.  He was admitted for further  evaluation and management.  Please see the full dictated admission  history and physical for details of the admission physical exam as well  as the laboratory data.   HOSPITAL COURSE:  1. Acute-on-chronic diarrhea.  As discussed above, the patient      reported that he had a longstanding history of chronic diarrhea      managed by Dr. Corinda Gubler in the past, and he was  chronically on      Questran daily, but as already discussed, he had been on      antibiotics for urine infection and the diarrhea worsened.  Upon      admission, stool studies were obtained for C diff and he was      empirically started on Flagyl for possible C diff.  He was also      maintained on Questran.  His diarrhea began to improve on this      regimen, and to further help with that, his Questran was increased      to b.i.d.  He has had two C diff samples back that and negative.      One of the stool samples is still pending at the time of this      dictation.  The patient's diarrhea has slowed down and is more      formed.  He really wants to go home today and follow up with his      outpatient physicians.  He has remained afebrile and  hemodynamically stable.  He will be discharged on the Flagyl,      follow up with his primary for the last C diff sample that is      pending at this time and he is to continue his Questran b.i.d.  2. Hypokalemia.  His potassium was replaced.  The impression was that      it was secondary to #1.  His last potassium today prior to      discharge is 3.9.  3. Esophageal stricture.  A barium swallow was done and the results as      stated above.  The patient has been tolerating diet well here in      the hospital.  The above results of the esophagram were discussed      with Dr. Evette Cristal, and he recommended that the patient follows up with      [facility] in 2-4 weeks.  4. Hypothyroidism.  The patient had a TSH level checked, and it was      elevated at 7.58.  His Synthroid dose was increased from 100 to 112      mcg.  He will be discharged on this dose and is to follow up with      his primary care physician for recheck of thyroid function tests in      4-6 weeks and further adjustment of his dose as clinically      appropriate.   His other chronic medical problems remained stable during this hospital  stay, and he is to continue his outpatient  medications upon discharge.   DISCHARGE MEDICATIONS:  1. Flagyl 500 mg p.o. q.8 h.  2. Synthroid changed to 112 mcg p.o. daily.  3. Questran changed to 4 grams p.o. b.i.d.  4. The patient to continue vitamin D as previously.  5. Namenda 10 mg b.i.d.  6. Aspirin 81 mg p.o. daily.  7. Metamucil t.i.d. p.r.n. as previously.  8. Glucosamine 500 mg t.i.d.  9. Calcium 600 mg p.o. daily.  10.Metoprolol 25 mg p.o. daily.  11.HCTZ 25 mg p.o. daily.  12.KCl as previously.  13.Mobic as previously.   FOLLOWUP CARE:  1. Dr. Abigail Miyamoto in 1-2 weeks, call for appointment.  2. [facility] in 2-4 weeks for esophageal stricture.  The      patient to call [phone].   CONDITION ON DISCHARGE:  Improved.  Stable.   PENDING LABS:  C diff toxin x1.      Kela Millin, M.D.  Electronically Signed     ACV/MEDQ  D:  02/09/2009  T:  02/09/2009  Job:  [id number]   cc:   Chales Salmon. Abigail Miyamoto, M.D.  Fax: [phone]   [facility]

## 2010-12-01 NOTE — H&P (Signed)
NAME:  [name], [name]                  ACCOUNT NO.:  [id number]   MEDICAL RECORD NO.:  [id number]          PATIENT TYPE:  INP   LOCATION:  0109                         FACILITY:  [facility]   PHYSICIAN:  Corinna L. Lendell Caprice, MDDATE OF BIRTH:  [birth year]   DATE OF ADMISSION:  02/06/2009  DATE OF DISCHARGE:                              HISTORY & PHYSICAL   CHIEF COMPLAINT:  Diarrhea.   HISTORY OF PRESENT ILLNESS:  Mr. Huebert is an 77 year old male who  presented to his primary physician this morning secondary to complaint  of a 2 week history of loose stools.  He has also had some incontinence  secondary to leaking stool.  He has had increasing weakness and notes a  15-pound weight loss in the last 2 weeks.  The daughter-in-law with whom  he lives reports that this patient has taken antibiotics within the last  month for urinary tract infection.  We are asked to admit for further  evaluation and treatment.   ALLERGIES:  NO KNOWN DRUG ALLERGIES.   PAST MEDICAL HISTORY:  1. Hypercholesterolemia.  2. Hypertension.  3. Osteoarthritis.  4. Hypothyroid.  5. History of CVA.  6. Osteoarthritis.  7. Dementia.   PAST SURGICAL HISTORY:  1. Prostatectomy.  2. Cholecystectomy.  3. Removal of tailbone secondary to infection.   SOCIAL HISTORY:  Denies tobacco or alcohol use.  He lives with his  daughter-in-law.   FAMILY HISTORY:  His mother and father both died in their 98s from old  age.   MEDICATIONS:  1. Vitamin D 1000 international units p.o. daily.  2. Namenda 10 mg p.o. b.i.d.  3. Synthroid 100 mcg p.o. daily.  4. Mobic 7.5 mg 2 tablets p.o. daily.  5. Aspirin 81 mg p.o. daily.  6. Metamucil 5 caps p.o. t.i.d. p.r.n.  7. Questran 1 packet p.o. daily.  8. Glucosamine 500 mg p.o. t.i.d.  9. Calcium 600 mg p.o. daily.  10.Metoprolol 25 mg p.o. daily.  11.HCTZ 25 mg p.o. daily.  12.Klor-Con.   REVIEW OF SYSTEMS:  GENERAL:  Denies fever, chills.  ENT:  Chronic nasal  congestion.   CARDIOVASCULAR:  No chest pain, no palpitations.  RESPIRATORY:  Mild shortness of breath x2 days.  GI:  Positive dysphagia  with choking easily for approximately 3 weeks.  He denies melena or  bright red blood per rectum.  SKIN:  Denies rashes.  GU:  No dysuria, no  hematuria.  NEURO:  Denies vision or hearing changes.  MUSCULOSKELETAL:  Denies muscle pain.  He has chronic osteoarthritis.  HEMATOLOGY:  Denies  bruising.   LABORATORY DATA:  Hemoglobin 16.2, hematocrit 46.2, white blood cell  count of 8.1, platelets 199, sodium 139, potassium 2.4, chloride 110,  bicarb 22, BUN 16, creatinine 1.49, glucose 105, AST  34, ALT 31.   DIAGNOSTICS:  1. Abdominal x-ray notes an incidental 7 mm right renal calculus and      nonobstructive bowel gas pattern.  No pneumoperitoneum is noted.  2. Chest x-ray, question mild anterior midlung airspace disease,      stable mild chronic bronchial thickening with  a small linear      radiopaque density, question artifact.   PHYSICAL EXAMINATION:  VITAL SIGNS:  BP 127/87, heart rate 88 down from  113, respiratory rate 20, temperature 97.4, O2 sat 94% on room air.  GENERAL:  Elderly white male appears weak.  Smells of stool.  ENT:  Dry oral mucosa.  CARDIOVASCULAR:  S1 and S2, regular rate and rhythm.  No lower extremity  edema is noted.  RESPIRATORY:  Breath sounds are clear to auscultation bilaterally  without wheezes, rales or rhonchi.  No increased work of breathing.  ABDOMEN:  Distended.  Positive bowel sounds are noted.  Nontender.  EXTREMITIES:  No lower extremity edema.  PSYCHIATRIC:  Calm, pleasant, answers questions appropriately.  NEURO:  He is moving all extremities.  Speech is clear.  Positive facial  symmetry is noted.   ASSESSMENT/PLAN:  1. Diarrhea plus dehydration, rule out clostridium difficile colitis.      We will check stool for C. diff, culture, O and P.  We will start      empiric treatment with oral Flagyl.  We will give him gentle  IV      hydration and place him on a clear liquid diet as tolerated.  2. Dysphagia x3 weeks.  We will consider GI evaluation for further      evaluation of number 1 and dysphagia.  In the meantime, we will get      a barium esophagram for further evaluation.  3. Hypertension.  His blood pressure is stable.  We will hold      hydrochlorothiazide secondary to dehydration and plan to continue      beta-blocker.  4. Osteoarthritis, currently at baseline.  Hold Mobic secondary to      renal insufficiency  5. History of hypothyroid.  We will continue Synthroid.  Check TSH.  6. History of cerebrovascular accident, stable currently at baseline.  7. Dementia, appears stable.  Continue Namenda.  8. Acute renal insufficiency, creatinine 1.2, November 12, 2008.  Plan is      gentle IV hydration.  Hold Mobic and HCTZ.  9. Hypokalemia.  Replete p.o. and check mag level.   DISPOSITION:  PT, OT evaluation.      Sandford Craze, NP      Corinna L. Lendell Caprice, MD  Electronically Signed    MO/MEDQ  D:  02/06/2009  T:  02/06/2009  Job:  [id number]

## 2010-12-01 NOTE — Op Note (Signed)
NAME:  [name], [name] NO.:  [id number]   MEDICAL RECORD NO.:  [id number]          PATIENT TYPE:  AMB   LOCATION:  ENDO                         FACILITY:  [facility]   PHYSICIAN:  Graylin Shiver, M.D.   DATE OF BIRTH:  09-29-1921   DATE OF PROCEDURE:  03/06/2009  DATE OF DISCHARGE:                               OPERATIVE REPORT   PROCEDURE PERFORMED:  Esophagogastroduodenoscopy with biopsy and  endoscopic balloon dilatation.   INDICATIONS FOR PROCEDURE:  Dysphagia.  Barium swallow showed evidence  of a stricture in the distal esophagus which impeded passage of a barium  tablet.   Informed consent was obtained after explanation of the risks of  bleeding, infection and perforation.   PREMEDICATION:  Fentanyl 50 mcg IV, Versed 4 mg IV.   PROCEDURE:  With the patient in the left lateral decubitus position, the  Pentax gastroscope was inserted into the oropharynx and passed into the  esophagus.  It was advanced down the esophagus to the distal esophagus  where there was some resistance to the passage of the scope into the  stomach.  At this level of resistance was a Schatzki's ring.  The  stomach was then entered.  There was a small hiatal hernia seen.  The  scope was then advanced down into the duodenum.  The second portion and  bulb of the duodenum looked normal.  The stomach showed erythema.  A  biopsy was obtained for CLOtest.  The fundus and cardia looked normal as  far as the mucosa was concerned.  The scope was then brought back into  the esophagus.  An endoscopic balloon dilator was advanced down the  scope and appropriately placed at the level of the Schatzki's ring.  It  was inflated to 15 then 16.5 then 18 mm and held in place at each level  for 1 minute.  The balloon was then deflated and removed.  There was  some heme at the site of dilatation.  The rest of the esophagus looked  normal.  He tolerated the procedure well without complications.    IMPRESSION:  1. Schatzki's ring dilated to 18 mm.  2. Small hiatal hernia.  3. Gastritis, CLOtest pending.   PLAN:  Observe response to dilatation.           ______________________________  Graylin Shiver, M.D.     SFG/MEDQ  D:  03/06/2009  T:  03/06/2009  Job:  [id number]   cc:   Chales Salmon. Abigail Miyamoto, M.D.  Fax: [phone]

## 2010-12-04 NOTE — Op Note (Signed)
[facility]  Patient:    [name], [name] Visit Number: [id number] MRN: [id number]          Service Type: NES Location: NESC Attending Physician:  Trisha Mangle Dictated by:   Veverly Fells Vernie Ammons, M.D. Proc. Date: 02/05/02 Admit Date:  02/05/2002 Discharge Date: 02/05/2002                             Operative Report  PREOPERATIVE DIAGNOSIS:  Bladder neck contracture.  POSTOPERATIVE DIAGNOSIS:  Bladder neck contracture.  PROCEDURE:  Transurethral resection bladder neck contracture with holmium laser.  SURGEON:  Mark C. Vernie Ammons, M.D.  ANESTHESIA:  General LMA.  DRAINS:  None.  SPECIMENS:  None.  ESTIMATED BLOOD LOSS:  Less than 1 cc.  COMPLICATIONS:  None.  INDICATIONS FOR PROCEDURE:  The patient is a 77 year old white male who underwent a TURP in 1984. He had a bladder neck contracture and ended up having to have three incisions of his bladder neck contractures in the past. The last one was April of 2001. He has noted some slowing of his stream and I found elevated residual recently. Cystoscopic exam in my office revealed recurrence of his bladder neck contracture. He is therefore brought to the OR for incision understanding the risks, complications, alternatives and limitations.  DESCRIPTION OF PROCEDURE:  After informed consent, the patient was brought to the major OR, placed on the table, administered general anesthesia and then laid in the dorsal lithotomy position. His genitalia was sterilely prepped and draped and a 22 French cystoscope with a 12 degree lens was inserted and the urethra was noted to be normal down to the sphincter which appears intact. The prostatic urethra was well resected, but there was a bladder neck contracture noted which was photographed. I then used the 500 micron fiber and the holmium laser set at 10 watts to incise the bladder neck at the 12, 3, 6 and 9 oclock positions. This opened it up nicely. It was  very dense scar tissue that the laser cut through easily. I then injected a combination of 40 mg of Kenalog and 125 mg of Solu-Medrol into each of the four incision sites using the 27 gauge transurethral needle through the cystoscope. The bladder was fully inspected during the procedure and noted to be free of any tumor, stones or inflammatory lesions. I then drained the bladder, removed the cystoscope and 2% lidocaine jelly was instilled in the urethra. The patient was awakened and taken to the recovery room in stable satisfactory condition. The patient tolerated the procedure well with no intraoperative complications. Dictated by:   Veverly Fells Vernie Ammons, M.D. Attending Physician:  Trisha Mangle DD:  02/05/02 TD:  02/09/02 Job: [phone] [id number]

## 2010-12-04 NOTE — Cardiovascular Report (Signed)
[place]  Patient:    [name], [name]                           MRN: [id number] Proc. Date: 10/29/99 Attending:  Lewayne Bunting, M.D. [facility] CC:         Cardiac Catheterization Laboratory             Winn [place]ock. Charmian Muff, M.D.             Ulyess Mort, M.D. [facility]                        Cardiac Catheterization  DATE OF BIRTH:  15-Jun-1922  PROCEDURE PERFORMED: 1. Left heart catheterization. 2. Ventriculography. 3. Selective coronary angiography.  DIAGNOSES: 1. No flow-limiting coronary artery disease. 2. Normal left ventricular systolic function. 3. Normal left ventricular end diastolic pressure.  CARDIOLOGIST:  Lewayne Bunting, M.D.  INDICATIONS:  [name] is a 77 year old white male with a few risk factors for  coronary artery disease; however, the patient has recently been complaining of increased substernal chest pain.  A stress test study was done and this showed  small area of lateral wall hypokinesis.  The patient has now been referred for  diagnostic cardiac catheterization, to assess his coronary anatomy.  DESCRIPTION OF PROCEDURE:  After an informed consent was obtained, the patient as brought to the cardiac catheterization laboratory.  the right groin was sterilely prepped and draped.  Lidocaine 1% was used to infiltrate the right groin.  A 6-French arterial sheath was placed using the modified Seldinger technique. The 6-French JL4 and JR4 catheters were used to engage the left and the right coronary arteries.  Selective angiography was performed in various projections using manual injections of contrast.  After selective coronary angiography, a 6-French pigtail catheter was advanced into the left ventricle.  Appropriate hemodynamics were obtained.  A left ventriculogram was then performed using power injections of contrast. The pigtail catheter was then pulled back.  At the termination of the case the catheter and sheath were  removed and manual pressure applied until adequate hemostasis was achieved.  The patient tolerated the procedure well and was transferred to the floor in stable condition.  FINDINGS: HEMODYNAMICS: Left ventricular pressure:  119/59 mmHg. Aortic pressure:  119/13 mmHg.  SELECTIVE CORONARY ANGIOGRAPHY: 1. Left main coronary artery:  The left main coronary artery is a large-    caliber vessel with no flow-limiting coronary artery disease. 2. Left anterior descending coronary artery:  The left anterior descending    artery was a moderate-caliber vessel with evidence of calcifications in    the proximal segment.  There were some mild irregularities in the proximal    part of the vessel, estimated at 10% stenosis; however, the remainder of    the vessel was free of flow-limiting coronary artery disease.  Four diagonal    branches arise from the LAD.  The first two diagonal branches are moderate-    sized caliber. 3. Left circumflex coronary artery:  The circumflex coronary artery gives rise    to two obtuse marginal branches.  There is on flow-limiting coronary artery    disease. 4. Right coronary artery:  The right coronary artery is a large vessel which    shows diffuse irregularities, but no flow-limiting disease.  The coronary    system is right-dominant.  A large posterior descending artery arises from  the right coronary artery, as well as several smaller posterolateral    branches.  VENTRICULOGRAPHY:  The ventriculography performed in the single plane RAO projection revealed an ejection fraction quantitated at 68%, with no wall motion abnormalities.  CONCLUSION: 1. No evidence for flow-limiting coronary artery disease. 2. Mild coronary plaquing. 3. Normal left ventricular systolic function.  RECOMMENDATIONS:  The results have been discussed with the patient and with his  family.  The plan is to continue aspirin therapy.  A lipid panel should be done in approximately six  weeks.  Aggressive lipid lowering is indicated; however, no additional medical therapy short of an ACE inhibitor would be required at this point in time. DD:  10/29/99 TD:  10/29/99 Job: [id number]

## 2010-12-04 NOTE — Op Note (Signed)
NAME:  [name], [name]                            ACCOUNT NO.:  [id number]   MEDICAL RECORD NO.:  [id number]                   PATIENT TYPE:  AMB   LOCATION:  NESC                                 FACILITY:  [facility]   PHYSICIAN:  Mark C. Vernie Ammons, M.D.               DATE OF BIRTH:  08/13/1921   DATE OF PROCEDURE:  12/03/2002  DATE OF DISCHARGE:                                 OPERATIVE REPORT   PREOPERATIVE DIAGNOSIS:  Bladder neck contracture.   POSTOPERATIVE DIAGNOSIS:  Bladder neck contracture.   PROCEDURE:  Transurethral resection of bladder neck contracture.   SURGEON:  Mark C. Vernie Ammons, M.D.   ANESTHESIA:  General.   BLOOD LOSS:  Less than 1 mL.   DRAINS:  None.   SPECIMENS:  None.   COMPLICATIONS:  None.   INDICATIONS:  The patient is a 77 year old white male, who has developed  slowing of his urinary stream, hesitancy, and does not feel like he emptied  his bladder completely.  I checked an ultrasound PVR; it was 220 mL.  He has  had multiple procedures for bladder neck contracture, and he is brought back  today for incision of recurrence of bladder neck contracture.   DESCRIPTION OF OPERATION:  After informed consent, the patient brought to  the major OR, placed on table, administered general anesthesia, then moved  to the dorsal lithotomy position.  His genitalia was sterilely prepped and  draped, and a 28 French resectoscope was then introduced into the urethra  and the [employer] element inserted with the 12 degree lens.  His  previously resected prostatic fossa was noted to be free of any lesions.  The sphincter appeared intact as well as the veru.  The bladder neck had  mild contracture.  It was incised at the 12, 6, 9, and 3 o'clock positions,  opening it up nicely.  The remainder of the bladder was fully inspected and  noted to be free of any tumor, stones, or inflammatory lesions.  There was 1-  2+ trabeculation.  The bladder was then drained, and the  patient was  awakened and taken to the recovery room in stable satisfactory condition.  He tolerated the procedure well with no intraoperative complications.   I will consider having the patient perform intermittent self-  catheterization, but what I would like to try to determine is whether he has  some degree of bladder hypotonicity as well.  I will continue to monitor the  bladder neck and as long as it remains patent, hold off on teaching him  intermittent self-catheterization.  A trial of Urecholine will also be  considered.  Mark C. Vernie Ammons, M.D.    MCO/MEDQ  D:  12/03/2002  T:  12/03/2002  Job:  [id number]

## 2010-12-04 NOTE — Op Note (Signed)
NAME:  [name], [name]                            ACCOUNT NO.:  [id number]   MEDICAL RECORD NO.:  [id number]                   PATIENT TYPE:  AMB   LOCATION:  NESC                                 FACILITY:  [facility]   PHYSICIAN:  Mark C. Vernie Ammons, M.D.               DATE OF BIRTH:  May 07, 1922   DATE OF PROCEDURE:  08/23/2003  DATE OF DISCHARGE:                                 OPERATIVE REPORT   PREOPERATIVE DIAGNOSIS:  History of bladder neck contracture.   POSTOPERATIVE DIAGNOSIS:  History of bladder neck contracture.   OPERATION/PROCEDURE:  Cystoscopy.   SURGEON:  Mark C. Vernie Ammons, M.D.   ANESTHESIA:  General.   ESTIMATED BLOOD LOSS:  None.   SPECIMENS:  None.   DRAINS:  None.   COMPLICATIONS:  None.   INDICATIONS:  The patient is a 77 year old white male who underwent TURP in  1984.  He developed a bladder neck contracture that resection of his bladder  neck contracture in May 1999.  It recurred and was associated with a bladder  stone requiring repeat resection of the bladder neck in April 2001.  It then  recurred further in July 2003 and required a third transurethral resection  of his bladder neck.  After that, he seemed to be doing well and things were  stable until he called me recently indicating that he felt things had  worsened again and this time we discussed placing a Urolume stent.  The  risks and complications have been discussed and he has elected to proceed.   DESCRIPTION OF PROCEDURE:  After informed consent, the patient was brought  to the major OR and placed on the table, administered general anesthesia and  moved to the dorsal lithotomy position.  Genitalia was sterilely prepped and  draped and a 23-French cystoscope was introduced into the urethra.  The 12-  degree lens was inserted and the urethra was noted to be entirely normal  without strictures or lesions.  The sphincter appears intact.  The prostate  was well resected and upon entering the prostatic  fossa, I noted the bladder  neck was extremely patent, so much so that it appeared to have absolutely no  narrowing at the level of the bladder neck.  No scarring or other  abnormality.  There was 1+ trabeculation in the bladder.  No tumor, stones  or inflammatory lesions were seen within.  I, therefore, drained the bladder  and removed the cystoscope and the patient was taken to the recovery room.                                               Mark C. Vernie Ammons, M.D.    MCO/MEDQ  D:  08/23/2003  T:  08/23/2003  Job:  [id number]

## 2010-12-04 NOTE — Discharge Summary (Signed)
NAME:  [name], TABET                  ACCOUNT NO.:  [id number]   MEDICAL RECORD NO.:  [id number]          PATIENT TYPE:  INP   LOCATION:  3003                         FACILITY:  [facility]   PHYSICIAN:  Rene Paci, M.D. LHCDATE OF BIRTH:  May 27, 1922   DATE OF ADMISSION:  08/28/2004  DATE OF DISCHARGE:  08/31/2004                                 DISCHARGE SUMMARY   DISCHARGE DIAGNOSES:  1.  Sepsis secondary to E. coli urinary tract infection.  Positive blood      cultures and urine cultures sensitive to Cipro.  2.  Chronic urinary retention with history of bladder outlet obstruction and      neck stricture.  Self-cath prior to admission likely cause of #1.   DISCHARGE MEDICATIONS:  Cipro 500 mg p.o. b.i.d. x2 weeks.   Other medications are as prior to admission without change.  The patient is  instructed to call Dr. Drue Novel for followup appointment this week as well as  with urologist Dr. Vernie Ammons to discuss question need for suppressive  antibiotic therapy with chronic urologic conditions.   CONDITION ON DISCHARGE:  Medically stable tolerating p.o.  Reviewed and  understands plans for discharge meds and followup.   HOSPITAL COURSE:  1.  E. coli urinary tract infection with sepsis.  The patient is a pleasant      77 year old gentleman who self-caths for history of chronic urinary      obstruction with bladder outlet obstruction and bladder neck stricture      who presented with weakness and fever.  His temperature was 101 and his      urine showed numerous bacteria and white cells.  He was admitted for      rule out urosepsis, blood cultures x2 as well as urine culture grew E.      coli  which was sensitive to Cipro.  His IV medications were changed to      oral and he remained hemodynamically stable.  By the third day, the      patient was stable for discharge home and agrees to follow up with      primary MD and urology regarding question of need for chronic      antidepressant  therapy.  2.  Other medical issues.  The patient's other medications are as prior to      admission including his history of hypothyroidism, gastroesophageal      reflux disease, and nonobstructive coronary artery disease.  No other      changes are made in his medical regimen.      VL/MEDQ  D:  11/10/2004  T:  11/10/2004  Job:  [id number]

## 2010-12-04 NOTE — H&P (Signed)
NAME:  [name], CRACE NO.:  [id number]   MEDICAL RECORD NO.:  [id number]          PATIENT TYPE:  INP   LOCATION:  3003                         FACILITY:  [facility]   PHYSICIAN:  Gordy Savers, M.D. LHCDATE OF BIRTH:  Jul 28, 1921   DATE OF ADMISSION:  08/28/2004  DATE OF DISCHARGE:                                HISTORY & PHYSICAL   CHIEF COMPLAINT:  Weakness.   HISTORY OF PRESENT ILLNESS:  The patient is an 77 year old white gentleman  who was stable until approximately 2 weeks ago.  At that time, he presented  to his primary care physician with a URI.  He apparently was treated with  Amoxicillin.  He also states for the past month he has had nonspecific  anterior chest pain.  Due to progressive weakness, he presents to the ER for  evaluation.  He is noted to have pyuria and a fever of 101 degrees.  He is  now admitted for treatment of possible early urosepsis.   PAST MEDICAL HISTORY:  The patient has been followed by Dr. Vernie Ammons for  bladder neck contractures.  In February of last year, he underwent  cystoscopy and in May of 2004 and July of 2003 underwent TUR of the bladder  neck contractures.  Past medical history is also pertinent for a history of  hypothyroidism, dyspepsia, and a history of nonobstructive coronary artery  disease.  He had a cardiac catheterization in April of 2001.  Past medical  history otherwise noncontributory.   SOCIAL HISTORY:  He married, lives with his wife.  He is accompanied by a  son.  He is a former smoker but not in 20 years.   MEDICATIONS:  1.  Synthroid 0.1 mg daily.  2.  Nexium, Ambien and aspirin.   PHYSICAL EXAMINATION:  Revealed an elderly white male who is weak, quite  alert, in no acute distress.  Temperature was 101 degrees.  SKIN:  Warm and dry without rash.  HEAD AND NECK:  Normal fundi.  Ear, nose and throat clear.  Mucous membranes  appeared well hydrated.  Neck:  No bruits or adenopathy.  CHEST:  Clear.  CARDIOVASCULAR:  Normal S1, S2 without murmurs.  ABDOMEN:  Soft, nontender.  EXTREMITIES:  Negative, no edema.  Peripheral pulses were full.  NEURO:  Negative.   IMPRESSION:  Pyuria, fever, rule out early urosepsis.  Hypothyroidism.  History of recurrent bladder neck contractures.   DISPOSITION:  The patient will be admitted to the hospital.  Blood and urine  cultures will be obtained.  He will be treated with empiric antibiotics  pending results of the urine culture.  He will require followup urological  evaluation.      PFK/MEDQ  D:  08/29/2004  T:  08/29/2004  Job:  [id number]

## 2011-01-03 ENCOUNTER — Emergency Department (HOSPITAL_BASED_OUTPATIENT_CLINIC_OR_DEPARTMENT_OTHER)
Admission: EM | Admit: 2011-01-03 | Discharge: 2011-01-03 | Disposition: A | Discharge status: Home / Self Care | Payer: Medicare Other | Attending: Emergency Medicine | Admitting: Emergency Medicine

## 2011-01-03 ENCOUNTER — Emergency Department (INDEPENDENT_AMBULATORY_CARE_PROVIDER_SITE_OTHER): Payer: Medicare Other

## 2011-01-03 DIAGNOSIS — F0390 Unspecified dementia without behavioral disturbance: Secondary | ICD-10-CM | POA: Insufficient documentation

## 2011-01-03 DIAGNOSIS — N390 Urinary tract infection, site not specified: Secondary | ICD-10-CM | POA: Insufficient documentation

## 2011-01-03 DIAGNOSIS — R197 Diarrhea, unspecified: Secondary | ICD-10-CM

## 2011-01-03 DIAGNOSIS — Z79899 Other long term (current) drug therapy: Secondary | ICD-10-CM | POA: Insufficient documentation

## 2011-01-03 DIAGNOSIS — R5381 Other malaise: Secondary | ICD-10-CM | POA: Insufficient documentation

## 2011-01-03 DIAGNOSIS — I10 Essential (primary) hypertension: Secondary | ICD-10-CM | POA: Insufficient documentation

## 2011-01-03 DIAGNOSIS — K219 Gastro-esophageal reflux disease without esophagitis: Secondary | ICD-10-CM | POA: Insufficient documentation

## 2011-01-03 DIAGNOSIS — E039 Hypothyroidism, unspecified: Secondary | ICD-10-CM | POA: Insufficient documentation

## 2011-01-03 LAB — TROPONIN I: Troponin I: <0.3 ng/mL (ref <0.30)

## 2011-01-03 LAB — CK TOTAL AND CKMB (NOT AT ARMC)
CK, MB: 9 ng/mL (ref 0.3–4.0)
Relative Index: 2.6 — ABNORMAL HIGH (ref 0.0–2.5)
Total CK: 350 U/L — ABNORMAL HIGH (ref 7–232)
Total CK: 327 U/L — ABNORMAL HIGH (ref 7–232)

## 2011-01-03 LAB — COMPREHENSIVE METABOLIC PANEL
ALT: 25 U/L (ref 0–53)
Albumin: 3.8 g/dL (ref 3.5–5.2)
Alkaline Phosphatase: 56 U/L (ref 39–117)
BUN: 13 mg/dL (ref 6–23)
Chloride: 106 mEq/L (ref 96–112)
Glucose, Bld: 99 mg/dL (ref 70–99)
Potassium: 4.2 mEq/L (ref 3.5–5.1)
Sodium: 141 mEq/L (ref 135–145)
Total Bilirubin: 0.7 mg/dL (ref 0.3–1.2)

## 2011-01-03 LAB — URINALYSIS, ROUTINE W REFLEX MICROSCOPIC
Ketones, ur: NEGATIVE mg/dL
Nitrite: NEGATIVE
Protein, ur: NEGATIVE mg/dL

## 2011-01-03 LAB — URINE MICROSCOPIC-ADD ON

## 2011-01-05 LAB — URINE CULTURE
Colony Count: 80000
Culture  Setup Time: 201206180018

## 2011-04-20 LAB — CBC
HCT: 36.8 — ABNORMAL LOW
Hemoglobin: 12.8 — ABNORMAL LOW
MCV: 92
RBC: 3.99 — ABNORMAL LOW
WBC: 7.8

## 2011-04-20 LAB — BASIC METABOLIC PANEL
BUN: 8
Chloride: 110
GFR calc Af Amer: >60
GFR calc non Af Amer: >60
Potassium: 3.1 — ABNORMAL LOW
Sodium: 141

## 2011-04-20 LAB — URINE CULTURE: Culture: NO GROWTH

## 2011-04-20 LAB — GLUCOSE, CAPILLARY

## 2011-04-21 LAB — POCT CARDIAC MARKERS
CKMB, poc: 1.4
CKMB, poc: 2.1
Myoglobin, poc: 153
Myoglobin, poc: 158
Troponin i, poc: <0.05
Troponin i, poc: <0.05

## 2011-04-21 LAB — BASIC METABOLIC PANEL
BUN: 17
Chloride: 100
GFR calc Af Amer: >60
GFR calc non Af Amer: >60
Potassium: 3.4 — ABNORMAL LOW
Sodium: 137

## 2011-04-21 LAB — CBC
HCT: 44.6
MCV: 90.3
Platelets: 177
RBC: 4.94
WBC: 11.2 — ABNORMAL HIGH

## 2011-04-21 LAB — DIFFERENTIAL
Eosinophils Absolute: 0
Eosinophils Relative: 0
Lymphocytes Relative: 10 — ABNORMAL LOW
Lymphs Abs: 1.1
Monocytes Relative: 10

## 2011-04-21 LAB — URINALYSIS, ROUTINE W REFLEX MICROSCOPIC
Bilirubin Urine: NEGATIVE
Nitrite: NEGATIVE
Protein, ur: NEGATIVE
Urobilinogen, UA: 0.2

## 2011-04-21 LAB — ETHANOL: Alcohol, Ethyl (B): <5

## 2011-04-21 LAB — URINE CULTURE: Colony Count: 75000

## 2011-04-21 LAB — PROTIME-INR
INR: 1
Prothrombin Time: 13.5

## 2011-04-21 LAB — POCT TOXICOLOGY PANEL

## 2011-04-23 LAB — BASIC METABOLIC PANEL
CO2: 24
Calcium: 9.1
Creatinine, Ser: 1.19
GFR calc Af Amer: >60
Glucose, Bld: 123 — ABNORMAL HIGH

## 2011-04-23 LAB — CBC
HCT: 35.1 — ABNORMAL LOW
HCT: 35.9 — ABNORMAL LOW
HCT: 44.3
Hemoglobin: 12.5 — ABNORMAL LOW
MCHC: 35.3
MCHC: 35.9
MCV: 90.1
MCV: 90.6
MCV: 91
Platelets: 164
Platelets: 186
RBC: 3.99 — ABNORMAL LOW
RBC: 4.16 — ABNORMAL LOW
RDW: 13.1
WBC: 11.2 — ABNORMAL HIGH
WBC: 11.9 — ABNORMAL HIGH
WBC: 13.3 — ABNORMAL HIGH
WBC: 7.7

## 2011-04-23 LAB — URINALYSIS, ROUTINE W REFLEX MICROSCOPIC
Bilirubin Urine: NEGATIVE
Ketones, ur: NEGATIVE
Nitrite: NEGATIVE
Protein, ur: NEGATIVE
Urobilinogen, UA: 0.2
pH: 6

## 2011-04-23 LAB — DIFFERENTIAL
Basophils Absolute: 0
Basophils Relative: 0
Lymphocytes Relative: 7 — ABNORMAL LOW
Lymphs Abs: 1
Monocytes Absolute: 0.8
Monocytes Relative: 9
Neutro Abs: 11.5 — ABNORMAL HIGH
Neutro Abs: 9.8 — ABNORMAL HIGH
Neutrophils Relative %: 83 — ABNORMAL HIGH

## 2011-04-23 LAB — URINE CULTURE: Colony Count: >=100000

## 2011-04-23 LAB — CULTURE, BLOOD (ROUTINE X 2)

## 2011-04-23 LAB — MAGNESIUM: Magnesium: 1.6

## 2011-04-23 LAB — COMPREHENSIVE METABOLIC PANEL
Albumin: 4.2
BUN: 11
Chloride: 100
Creatinine, Ser: 1.25
GFR calc non Af Amer: 55 — ABNORMAL LOW
Total Bilirubin: 1.1

## 2012-09-06 ENCOUNTER — Encounter (HOSPITAL_COMMUNITY): Payer: Self-pay | Admitting: Emergency Medicine

## 2012-09-06 ENCOUNTER — Emergency Department (HOSPITAL_COMMUNITY): Payer: Medicare Other

## 2012-09-06 ENCOUNTER — Inpatient Hospital Stay (HOSPITAL_COMMUNITY)
Admission: EM | Admit: 2012-09-06 | Discharge: 2012-09-08 | DRG: 871 | Disposition: A | Discharge status: Home Health | Payer: Medicare Other | Attending: Family Medicine | Admitting: Family Medicine

## 2012-09-06 DIAGNOSIS — I959 Hypotension, unspecified: Secondary | ICD-10-CM

## 2012-09-06 DIAGNOSIS — F0390 Unspecified dementia without behavioral disturbance: Secondary | ICD-10-CM

## 2012-09-06 DIAGNOSIS — R000 Tachycardia, unspecified: Secondary | ICD-10-CM

## 2012-09-06 DIAGNOSIS — Z87442 Personal history of urinary calculi: Secondary | ICD-10-CM

## 2012-09-06 DIAGNOSIS — N390 Urinary tract infection, site not specified: Secondary | ICD-10-CM

## 2012-09-06 DIAGNOSIS — A419 Sepsis, unspecified organism: Principal | ICD-10-CM

## 2012-09-06 DIAGNOSIS — N400 Enlarged prostate without lower urinary tract symptoms: Secondary | ICD-10-CM | POA: Diagnosis present

## 2012-09-06 DIAGNOSIS — E039 Hypothyroidism, unspecified: Secondary | ICD-10-CM

## 2012-09-06 DIAGNOSIS — Z87891 Personal history of nicotine dependence: Secondary | ICD-10-CM

## 2012-09-06 DIAGNOSIS — G9340 Encephalopathy, unspecified: Secondary | ICD-10-CM

## 2012-09-06 DIAGNOSIS — Z79899 Other long term (current) drug therapy: Secondary | ICD-10-CM

## 2012-09-06 DIAGNOSIS — Z87440 Personal history of urinary (tract) infections: Secondary | ICD-10-CM

## 2012-09-06 DIAGNOSIS — R509 Fever, unspecified: Secondary | ICD-10-CM

## 2012-09-06 DIAGNOSIS — J189 Pneumonia, unspecified organism: Secondary | ICD-10-CM | POA: Diagnosis present

## 2012-09-06 DIAGNOSIS — Z7982 Long term (current) use of aspirin: Secondary | ICD-10-CM

## 2012-09-06 DIAGNOSIS — D696 Thrombocytopenia, unspecified: Secondary | ICD-10-CM

## 2012-09-06 DIAGNOSIS — I4510 Unspecified right bundle-branch block: Secondary | ICD-10-CM

## 2012-09-06 DIAGNOSIS — D6959 Other secondary thrombocytopenia: Secondary | ICD-10-CM | POA: Diagnosis present

## 2012-09-06 DIAGNOSIS — I10 Essential (primary) hypertension: Secondary | ICD-10-CM

## 2012-09-06 HISTORY — DX: Urinary tract infection, site not specified: N39.0

## 2012-09-06 HISTORY — DX: Calculus of kidney: N20.0

## 2012-09-06 LAB — CBC WITH DIFFERENTIAL/PLATELET
Basophils Relative: 0 % (ref 0–1)
Eosinophils Absolute: 0.1 10*3/uL (ref 0.0–0.7)
Eosinophils Relative: 1 % (ref 0–5)
HCT: 40.1 % (ref 39.0–52.0)
Hemoglobin: 14.5 g/dL (ref 13.0–17.0)
Lymphs Abs: 0.8 10*3/uL (ref 0.7–4.0)
MCH: 32.8 pg (ref 26.0–34.0)
MCHC: 36.2 g/dL — ABNORMAL HIGH (ref 30.0–36.0)
MCV: 90.7 fL (ref 78.0–100.0)
Monocytes Absolute: 0.8 10*3/uL (ref 0.1–1.0)
Monocytes Relative: 10 % (ref 3–12)
Neutrophils Relative %: 80 % — ABNORMAL HIGH (ref 43–77)
RBC: 4.42 MIL/uL (ref 4.22–5.81)

## 2012-09-06 LAB — COMPREHENSIVE METABOLIC PANEL
Albumin: 3.3 g/dL — ABNORMAL LOW (ref 3.5–5.2)
BUN: 13 mg/dL (ref 6–23)
Calcium: 9.4 mg/dL (ref 8.4–10.5)
Creatinine, Ser: 1.2 mg/dL (ref 0.50–1.35)
GFR calc Af Amer: 60 mL/min — ABNORMAL LOW (ref >90)
Glucose, Bld: 133 mg/dL — ABNORMAL HIGH (ref 70–99)
Total Protein: 6.5 g/dL (ref 6.0–8.3)

## 2012-09-06 LAB — URINALYSIS, ROUTINE W REFLEX MICROSCOPIC
Bilirubin Urine: NEGATIVE
Glucose, UA: NEGATIVE mg/dL
Specific Gravity, Urine: 1.021 (ref 1.005–1.030)
Urobilinogen, UA: 0.2 mg/dL (ref 0.0–1.0)

## 2012-09-06 LAB — URINE MICROSCOPIC-ADD ON

## 2012-09-06 MED ORDER — AMITRIPTYLINE HCL 10 MG PO TABS
20.0000 mg | ORAL_TABLET | Freq: Every day | ORAL | Status: DC
  Administered 2012-09-07 (×2): 20 mg via ORAL
  Filled 2012-09-06 (×3): qty 2

## 2012-09-06 MED ORDER — CALCIUM CARBONATE 1250 (500 CA) MG PO TABS
1.0000 | ORAL_TABLET | Freq: Every day | ORAL | Status: DC
  Administered 2012-09-07 – 2012-09-08 (×2): 500 mg via ORAL
  Filled 2012-09-06 (×2): qty 1

## 2012-09-06 MED ORDER — ACETAMINOPHEN 325 MG PO TABS
650.0000 mg | ORAL_TABLET | Freq: Four times a day (QID) | ORAL | Status: DC | PRN
  Administered 2012-09-07: 650 mg via ORAL
  Filled 2012-09-06: qty 2

## 2012-09-06 MED ORDER — PANTOPRAZOLE SODIUM 40 MG PO TBEC
40.0000 mg | DELAYED_RELEASE_TABLET | Freq: Every day | ORAL | Status: DC
  Administered 2012-09-07 – 2012-09-08 (×2): 40 mg via ORAL
  Filled 2012-09-06 (×2): qty 1

## 2012-09-06 MED ORDER — TAMSULOSIN HCL 0.4 MG PO CAPS
0.4000 mg | ORAL_CAPSULE | Freq: Every day | ORAL | Status: DC
  Administered 2012-09-07 – 2012-09-08 (×2): 0.4 mg via ORAL
  Filled 2012-09-06 (×3): qty 1

## 2012-09-06 MED ORDER — HEPARIN SODIUM (PORCINE) 5000 UNIT/ML IJ SOLN
5000.0000 [IU] | Freq: Three times a day (TID) | INTRAMUSCULAR | Status: DC
  Administered 2012-09-07 – 2012-09-08 (×5): 5000 [IU] via SUBCUTANEOUS
  Filled 2012-09-06 (×8): qty 1

## 2012-09-06 MED ORDER — AZITHROMYCIN 250 MG PO TABS
500.0000 mg | ORAL_TABLET | Freq: Once | ORAL | Status: AC
  Administered 2012-09-06: 500 mg via ORAL
  Filled 2012-09-06: qty 2

## 2012-09-06 MED ORDER — ACETAMINOPHEN 325 MG PO TABS
650.0000 mg | ORAL_TABLET | Freq: Once | ORAL | Status: AC
  Administered 2012-09-06: 650 mg via ORAL
  Filled 2012-09-06: qty 2

## 2012-09-06 MED ORDER — SODIUM CHLORIDE 0.9 % IV SOLN
1000.0000 mL | Freq: Once | INTRAVENOUS | Status: AC
  Administered 2012-09-06: 1000 mL via INTRAVENOUS

## 2012-09-06 MED ORDER — CYANOCOBALAMIN 250 MCG PO TABS
250.0000 ug | ORAL_TABLET | Freq: Every day | ORAL | Status: DC
  Administered 2012-09-07 – 2012-09-08 (×2): 250 ug via ORAL
  Filled 2012-09-06 (×2): qty 1

## 2012-09-06 MED ORDER — DEXTROSE 5 % IV SOLN
1.0000 g | Freq: Once | INTRAVENOUS | Status: AC
  Administered 2012-09-06: 1 g via INTRAVENOUS
  Filled 2012-09-06: qty 10

## 2012-09-06 MED ORDER — MEMANTINE HCL 10 MG PO TABS
10.0000 mg | ORAL_TABLET | Freq: Two times a day (BID) | ORAL | Status: DC
  Administered 2012-09-07 – 2012-09-08 (×4): 10 mg via ORAL
  Filled 2012-09-06 (×5): qty 1

## 2012-09-06 MED ORDER — POTASSIUM CHLORIDE CRYS ER 20 MEQ PO TBCR
40.0000 meq | EXTENDED_RELEASE_TABLET | Freq: Once | ORAL | Status: AC
  Administered 2012-09-06: 40 meq via ORAL
  Filled 2012-09-06: qty 2

## 2012-09-06 MED ORDER — AZITHROMYCIN 500 MG PO TABS
500.0000 mg | ORAL_TABLET | ORAL | Status: DC
  Filled 2012-09-06: qty 1

## 2012-09-06 MED ORDER — DEXTROSE 5 % IV SOLN
1.0000 g | INTRAVENOUS | Status: DC
  Filled 2012-09-06: qty 10

## 2012-09-06 MED ORDER — LEVOTHYROXINE SODIUM 100 MCG PO TABS
100.0000 ug | ORAL_TABLET | Freq: Every day | ORAL | Status: DC
  Administered 2012-09-07 – 2012-09-08 (×2): 100 ug via ORAL
  Filled 2012-09-06 (×3): qty 1

## 2012-09-06 MED ORDER — SODIUM CHLORIDE 0.9 % IV SOLN
1000.0000 mL | INTRAVENOUS | Status: DC
  Administered 2012-09-06: 1000 mL via INTRAVENOUS

## 2012-09-06 MED ORDER — OMEGA-3-ACID ETHYL ESTERS 1 G PO CAPS
1.0000 g | ORAL_CAPSULE | Freq: Two times a day (BID) | ORAL | Status: DC
  Administered 2012-09-07 – 2012-09-08 (×3): 1 g via ORAL
  Filled 2012-09-06 (×4): qty 1

## 2012-09-06 MED ORDER — ASPIRIN EC 81 MG PO TBEC
81.0000 mg | DELAYED_RELEASE_TABLET | Freq: Every day | ORAL | Status: DC
  Administered 2012-09-07 – 2012-09-08 (×2): 81 mg via ORAL
  Filled 2012-09-06 (×2): qty 1

## 2012-09-06 NOTE — ED Provider Notes (Signed)
History     CSN: [id number]  Arrival date & time 09/06/12  1919   First MD Initiated Contact with Patient 09/06/12 1920      Chief Complaint  Patient presents with  . Altered Mental Status    (Consider location/radiation/quality/duration/timing/severity/associated sxs/prior treatment) HPI Comments: [name] is a 79 y.o. Male who was at [facility], this evening, when he was noted to be confused. He had driven there alone. He was having trouble getting into his car. EMS arrived, and transported him here, for evaluation. He is unable to give much history.  Level V caveat: Altered mental status.  Patient is a 79 y.o. male presenting with altered mental status. The history is provided by the patient.  Altered Mental Status    Past Medical History  Diagnosis Date  . Kidney stones   . UTI (lower urinary tract infection)     frequent    History reviewed. No pertinent past surgical history.  No family history on file.  History  Substance Use Topics  . Smoking status: Former Games developer  . Smokeless tobacco: Not on file  . Alcohol Use: No      Review of Systems  Unable to perform ROS Psychiatric/Behavioral: Positive for altered mental status.    Allergies  Review of patient's allergies indicates no known allergies.  Home Medications   Current Outpatient Rx  Name  Route  Sig  Dispense  Refill  . acetaminophen (TYLENOL) 500 MG tablet   Oral   Take 500 mg by mouth 2 (two) times daily.         Marland Kitchen amitriptyline (ELAVIL) 10 MG tablet   Oral   Take 20 mg by mouth at bedtime.         Marland Kitchen aspirin EC 81 MG tablet   Oral   Take 81 mg by mouth daily.         . calcium carbonate (OS-CAL) 600 MG TABS   Oral   Take 600 mg by mouth daily.         . fish oil-omega-3 fatty acids 1000 MG capsule   Oral   Take 1 g by mouth 2 (two) times daily.         . hydrochlorothiazide (HYDRODIURIL) 25 MG tablet   Oral   Take 25 mg by mouth daily.         Marland Kitchen levothyroxine  (SYNTHROID, LEVOTHROID) 100 MCG tablet   Oral   Take 100 mcg by mouth daily.         . memantine (NAMENDA) 10 MG tablet   Oral   Take 10 mg by mouth 2 (two) times daily.         . metoprolol succinate (TOPROL-XL) 25 MG 24 hr tablet   Oral   Take 25 mg by mouth daily.         Marland Kitchen omeprazole (PRILOSEC) 20 MG capsule   Oral   Take 20 mg by mouth daily.         . silodosin (RAPAFLO) 8 MG CAPS capsule   Oral   Take 8 mg by mouth daily with breakfast.         . vitamin B-12 (CYANOCOBALAMIN) 250 MCG tablet   Oral   Take 250 mcg by mouth daily.           BP 93/55  Pulse 106  Temp(Src) 98.9 F (37.2 C) (Oral)  Resp 17  SpO2 94%  Physical Exam  Nursing note and vitals reviewed. Constitutional: He appears  well-developed.  Elderly, frail  HENT:  Head: Normocephalic and atraumatic.  Right Ear: External ear normal.  Left Ear: External ear normal.  Eyes: Conjunctivae and EOM are normal. Pupils are equal, round, and reactive to light.  Neck: Normal range of motion and phonation normal. Neck supple.  Cardiovascular: Regular rhythm, normal heart sounds and intact distal pulses.   Tachycardic  Pulmonary/Chest: Effort normal and breath sounds normal. No respiratory distress. He has no wheezes. He exhibits no tenderness and no bony tenderness.  Abdominal: Soft. Normal appearance. There is no tenderness.  Musculoskeletal: Normal range of motion.  Neurological: He is alert. He has normal strength. No cranial nerve deficit or sensory deficit. He exhibits normal muscle tone. Coordination normal.  Oriented to person, only. No achalasia. No dysarthria.  Skin: Skin is warm, dry and intact.  Psychiatric: He has a normal mood and affect. His behavior is normal.    ED Course  Procedures (including critical care time)  Emergency department treatment: IV fluid bolus; large volume and maintenance drip. Oral Tylenol. Empiric antibiotics started with IV, Rocephin, and oral  Zithromax  Reevaluation: 22:20. Persistent tachycardia, now, with mild hypotension. Oxygen saturation is normal. Respiratory rate is normal. He was treated with potassium orally for hypokalemia. He tolerated oral fluids.  Case discussed with on-call hospitalist, to admit the patient    Date: 05/05/2012  Rate: 113  Rhythm: sinus tachycardia  QRS Axis: normal  PR and QT Intervals: normal  ST/T Wave abnormalities: normal  PR and QRS Conduction Disutrbances:right bundle branch block  Narrative Interpretation:   Old EKG Reviewed: changes noted-rate faster   CRITICAL CARE Performed by: Mancel Bale L   Total critical care time: 35 minutes  Critical care time was exclusive of separately billable procedures and treating other patients.  Critical care was necessary to treat or prevent imminent or life-threatening deterioration.  Critical care was time spent personally by me on the following activities: development of treatment plan with patient and/or surrogate as well as nursing, discussions with consultants, evaluation of patient's response to treatment, examination of patient, obtaining history from patient or surrogate, ordering and performing treatments and interventions, ordering and review of laboratory studies, ordering and review of radiographic studies, pulse oximetry and re-evaluation of patient's condition.  Labs Reviewed  URINALYSIS, ROUTINE W REFLEX MICROSCOPIC - Abnormal; Notable for the following:    Hgb urine dipstick SMALL (*)    Leukocytes, UA TRACE (*)    All other components within normal limits  CBC WITH DIFFERENTIAL - Abnormal; Notable for the following:    MCHC 36.2 (*)    Platelets 146 (*)    Neutrophils Relative 80 (*)    Lymphocytes Relative 9 (*)    All other components within normal limits  COMPREHENSIVE METABOLIC PANEL - Abnormal; Notable for the following:    Potassium 3.1 (*)    Glucose, Bld 133 (*)    Albumin 3.3 (*)    AST 47 (*)    GFR calc non  Af Amer 52 (*)    GFR calc Af Amer 60 (*)    All other components within normal limits  CULTURE, BLOOD (ROUTINE X 2)  CULTURE, BLOOD (ROUTINE X 2)  URINE CULTURE  URINE MICROSCOPIC-ADD ON  CG4 I-STAT (LACTIC ACID)   Dg Chest Port 1 View  09/06/2012  *RADIOLOGY REPORT*  Clinical Data: Fever.  PORTABLE CHEST - 1 VIEW  Comparison: Two-view chest 01/03/2011.  Findings: Low lung volumes exaggerate the heart size.  New bibasilar airspace disease is  present, worse on the left.  The visualized soft tissues and bony thorax are unremarkable.  IMPRESSION:  1.  Low lung volumes with new bibasilar airspace disease, worse on the left.  While this likely reflects atelectasis, infection is not excluded.   Original Report Authenticated By: Marin Roberts, M.D.      1. Fever 41 degrees C or over   2. UTI (lower urinary tract infection)   3. Tachycardia   4. Hypotension       MDM  Altered mental status, with urinary tract infection. No evidence for severe sepsis. He has mild, persistent tachycardia, and mild hypotension. He has received 2 L of IV fluid. He'll need to be admitted for additional treatment and stabilization. Source of fever is most likely, urine infection. No specific symptoms to raise concern for pneumonia. A chest x-ray does not clearly indicate pneumonia.        Flint Melter, MD 09/06/12 2249

## 2012-09-06 NOTE — ED Notes (Signed)
Spoke with pt's son, reports his father is normally a&0x4. sts his father gets frequent UTIs that cause him dementia and has been hospitalized for them in the past. And that he is on a low dose antibiotic for that. Him and his wife are on the way here with medication list.

## 2012-09-06 NOTE — ED Notes (Signed)
Pt given cup of water, drank it with no issues.

## 2012-09-06 NOTE — ED Notes (Signed)
Per EMS - pt was at [facility], he is there every night staff said he wasn't acting his normal self. Pt unable to start his car, stumbling around. Pt denies any complaints. Pt is incontinent. HR 120s, RBBB. CBG 161. BP 132/76. Temp 101.9. EMS unaware of his baseline. EMS started an 18G in left hand.

## 2012-09-06 NOTE — ED Notes (Signed)
Pt sts he dpesn't know why he is here. Pt denies any pain. Pt denies weakness. Pt sts he cant remember what he has been doing today or last night. Pt sts he lives with his son [name], unsure of his phone number.

## 2012-09-07 DIAGNOSIS — E039 Hypothyroidism, unspecified: Secondary | ICD-10-CM

## 2012-09-07 DIAGNOSIS — F0390 Unspecified dementia without behavioral disturbance: Secondary | ICD-10-CM

## 2012-09-07 DIAGNOSIS — D696 Thrombocytopenia, unspecified: Secondary | ICD-10-CM

## 2012-09-07 DIAGNOSIS — A419 Sepsis, unspecified organism: Secondary | ICD-10-CM

## 2012-09-07 LAB — BASIC METABOLIC PANEL
Chloride: 108 mEq/L (ref 96–112)
Creatinine, Ser: 1.26 mg/dL (ref 0.50–1.35)
GFR calc Af Amer: 57 mL/min — ABNORMAL LOW (ref >90)
Potassium: 3.2 mEq/L — ABNORMAL LOW (ref 3.5–5.1)

## 2012-09-07 LAB — CBC
Platelets: 124 10*3/uL — ABNORMAL LOW (ref 150–400)
RDW: 13.2 % (ref 11.5–15.5)
WBC: 6.3 10*3/uL (ref 4.0–10.5)

## 2012-09-07 LAB — CLOSTRIDIUM DIFFICILE BY PCR: Toxigenic C. Difficile by PCR: NEGATIVE

## 2012-09-07 LAB — LEGIONELLA ANTIGEN, URINE: Legionella Antigen, Urine: NEGATIVE

## 2012-09-07 LAB — STREP PNEUMONIAE URINARY ANTIGEN: Strep Pneumo Urinary Antigen: NEGATIVE

## 2012-09-07 MED ORDER — CHOLESTYRAMINE 4 G PO PACK
4.0000 g | PACK | Freq: Two times a day (BID) | ORAL | Status: DC
  Administered 2012-09-07 – 2012-09-08 (×2): 4 g via ORAL
  Filled 2012-09-07 (×4): qty 1

## 2012-09-07 MED ORDER — POTASSIUM CHLORIDE CRYS ER 20 MEQ PO TBCR
40.0000 meq | EXTENDED_RELEASE_TABLET | Freq: Two times a day (BID) | ORAL | Status: AC
  Administered 2012-09-07 (×2): 40 meq via ORAL
  Filled 2012-09-07 (×2): qty 2

## 2012-09-07 MED ORDER — LEVOFLOXACIN IN D5W 750 MG/150ML IV SOLN
750.0000 mg | INTRAVENOUS | Status: DC
  Filled 2012-09-07: qty 150

## 2012-09-07 MED ORDER — LEVOFLOXACIN IN D5W 750 MG/150ML IV SOLN
750.0000 mg | INTRAVENOUS | Status: DC
  Administered 2012-09-07: 750 mg via INTRAVENOUS
  Filled 2012-09-07: qty 150

## 2012-09-07 NOTE — Progress Notes (Signed)
Utilization review completed.  

## 2012-09-07 NOTE — H&P (Signed)
[facility] History and Physical  [name] VHQ:[id number] DOB: 05-Oct-1921 DOA: 09/06/2012  Referring physician: ED PCP: No primary provider on file.  Specialists: None  Chief Complaint: Fever, AMS  HPI: [name] is a 79 y.o. male who presents to the ED with AMS and delerium onset earlier this evening at [facility].  Per his family and him he always gets delerium when ever he runs a fever (usually from a UTI).  He has had a "chest cold" for the past few days including productive cough.  In the ED he was initially delirious with fever of 101.5, he was initially tachycardic and mildly hypotensive as well, thankfully after treatment with fluids, rocephin, and tylenol, his fever, tachycardia, and delirium have all apparently resolved and he is now quite alert and oriented.  CXR demonstrated bibasilar atelectasis vs infiltrates, hospitalist has been asked to admit for Sepsis from a presumed CAP.  Review of Systems: No back nor flank pain, no kidney stone right now (always has severe pain with those he says) 12 systems reviewed and otherwise negative.  Past Medical History  Diagnosis Date  . Kidney stones   . UTI (lower urinary tract infection)     frequent   History reviewed. No pertinent past surgical history. Social History:  reports that he has quit smoking. He does not have any smokeless tobacco history on file. He reports that he does not drink alcohol or use illicit drugs.   No Known Allergies  No family history on file. 2 family members had influenza recently but that was 2 weeks ago.  Prior to Admission medications   Medication Sig Start Date End Date Taking? Authorizing Provider  acetaminophen (TYLENOL) 500 MG tablet Take 500 mg by mouth 2 (two) times daily.   Yes Historical Provider, MD  amitriptyline (ELAVIL) 10 MG tablet Take 20 mg by mouth at bedtime.   Yes Historical Provider, MD  aspirin EC 81 MG tablet Take 81 mg by mouth daily.   Yes Historical Provider, MD   calcium carbonate (OS-CAL) 600 MG TABS Take 600 mg by mouth daily.   Yes Historical Provider, MD  fish oil-omega-3 fatty acids 1000 MG capsule Take 1 g by mouth 2 (two) times daily.   Yes Historical Provider, MD  hydrochlorothiazide (HYDRODIURIL) 25 MG tablet Take 25 mg by mouth daily.   Yes Historical Provider, MD  levothyroxine (SYNTHROID, LEVOTHROID) 100 MCG tablet Take 100 mcg by mouth daily.   Yes Historical Provider, MD  memantine (NAMENDA) 10 MG tablet Take 10 mg by mouth 2 (two) times daily.   Yes Historical Provider, MD  metoprolol succinate (TOPROL-XL) 25 MG 24 hr tablet Take 25 mg by mouth daily.   Yes Historical Provider, MD  omeprazole (PRILOSEC) 20 MG capsule Take 20 mg by mouth daily.   Yes Historical Provider, MD  silodosin (RAPAFLO) 8 MG CAPS capsule Take 8 mg by mouth daily with breakfast.   Yes Historical Provider, MD  vitamin B-12 (CYANOCOBALAMIN) 250 MCG tablet Take 250 mcg by mouth daily.   Yes Historical Provider, MD   Physical Exam: Filed Vitals:   09/06/12 2200 09/06/12 2230 09/06/12 2300 09/06/12 2330  BP: 93/55 120/66 107/73 135/60  Pulse: 106 94 86 52  Temp:      TempSrc:      Resp: 17 22 21    SpO2: 94% 95% 95% 89%    General:  NAD, resting comfortably in bed, generally weak Eyes: PEERLA EOMI ENT: mucous membranes moist Neck: supple w/o  JVD Cardiovascular: RRR w/o MRG Respiratory: CTA B Abdomen: soft, nt, nd, bs+ Skin: no rash nor lesion Musculoskeletal: MAE, full ROM all 4 extremities Psychiatric: normal tone and affect Neurologic: AAOx3 (significant improvement from initial ED physician exam), grossly non-focal  Labs on Admission:  Basic Metabolic Panel:  Recent Labs Lab 09/06/12 2023  NA 138  K 3.1*  CL 103  CO2 22  GLUCOSE 133*  BUN 13  CREATININE 1.20  CALCIUM 9.4   Liver Function Tests:  Recent Labs Lab 09/06/12 2023  AST 47*  ALT 39  ALKPHOS 59  BILITOT 0.4  PROT 6.5  ALBUMIN 3.3*   No results found for this basename:  LIPASE, AMYLASE,  in the last 168 hours No results found for this basename: AMMONIA,  in the last 168 hours CBC:  Recent Labs Lab 09/06/12 2023  WBC 8.7  NEUTROABS 7.0  HGB 14.5  HCT 40.1  MCV 90.7  PLT 146*   Cardiac Enzymes: No results found for this basename: CKTOTAL, CKMB, CKMBINDEX, TROPONINI,  in the last 168 hours  BNP (last 3 results) No results found for this basename: PROBNP,  in the last 8760 hours CBG: No results found for this basename: GLUCAP,  in the last 168 hours  Radiological Exams on Admission: Dg Chest Port 1 View  09/06/2012  *RADIOLOGY REPORT*  Clinical Data: Fever.  PORTABLE CHEST - 1 VIEW  Comparison: Two-view chest 01/03/2011.  Findings: Low lung volumes exaggerate the heart size.  New bibasilar airspace disease is present, worse on the left.  The visualized soft tissues and bony thorax are unremarkable.  IMPRESSION:  1.  Low lung volumes with new bibasilar airspace disease, worse on the left.  While this likely reflects atelectasis, infection is not excluded.   Original Report Authenticated By: Marin Roberts, M.D.     EKG: Independently reviewed.  Assessment/Plan Principal Problem:   CAP (community acquired pneumonia)   1. CAP with sepsis - Fever and tachycardia on admission for 2/4 SIRS criteria, rocephin and azithromycin, tele monitor, holding BP meds, giving IVF. 2. AMS - Resolved, was apparently delirium with his fever. 3. Hypothyroidism - continue synthroid 4. HTN - holding HCTZ and metoprolol at this point since his BPs were runing somewhat on the low side in the ED.    Code Status: Full Code (must indicate code status--if unknown or must be presumed, indicate so) Family Communication: Spoke with multiple family members at bedside (indicate person spoken with, if applicable, with phone number if by telephone) Disposition Plan: Admit to inpatient (indicate anticipated LOS)  Time spent: 70 min  Analiza Cowger M. [facility] Pager [phone]  If 7PM-7AM, please contact night-coverage [email or web address] Password [facility] 09/07/2012, 12:20 AM

## 2012-09-07 NOTE — Care Management Note (Unsigned)
    Page 1 of 1   09/07/2012     4:15:41 PM   CARE MANAGEMENT NOTE 09/07/2012  Patient:  [name], [name]   Account Number:  [id number]  Date Initiated:  09/07/2012  Documentation initiated by:  GRAVES-BIGELOW,Yosmar Ryker  Subjective/Objective Assessment:   Pt admitted with fever of 101.5. CXR demonstrated bibasilar atelectasis vs infiltrates, hospitalist has been asked to admit for Sepsis from a presumed CAP.     Action/Plan:   CM will continue to monitor for disposition needs.   Anticipated DC Date:  09/11/2012   Anticipated DC Plan:  HOME W HOME HEALTH SERVICES      DC Planning Services  CM consult      Choice offered to / List presented to:             Status of service:  In process, will continue to follow Medicare Important Message given?   (If response is "NO", the following Medicare IM given date fields will be blank) Date Medicare IM given:   Date Additional Medicare IM given:    Discharge Disposition:    Per UR Regulation:  Reviewed for med. necessity/level of care/duration of stay  If discussed at Long Length of Stay Meetings, dates discussed:    Comments:

## 2012-09-07 NOTE — Progress Notes (Signed)
[facility] PROGRESS NOTE  [name] QIO:[id number] DOB: 28-Apr-1922 DOA: 09/06/2012 PCP: No primary provider on file.  Assessment/Plan: 1. Suspected community acquired pneumonia with sepsis: As suggested by history of cough although chest x-ray was somewhat equivocal. Rapidly improving with resolution of delirium, fever and hypotension. No hypoxia. Change to monotherapy with Levaquin. 2. Acute encephalopathy superimposed on dementia: Secondary to acute illness. Resolved. 3. Possible UTI: Culture pending, urinalysis equivocal. Does not appear that he is on suppressive antibiotics. Continue empiric antibiotic therapy. 4. Mild thrombocytopenia: Likely secondary to sepsis. Follow clinically. 5. Right bundle branch block: Chronic. Seen on EKG 01/04/2011. 6. History frequent UTIs to cause delirium 7. Hypertension: As hypotension resolved will resume hydrochlorothiazide, beta blocker 8. BPH: Continue Rapaflo. 9. Hypothyroidism: Continue Synthroid.  10. Dementia: Stable. Continue Namenda.  Code Status: Full code  Family Communication: None present Disposition Plan: Home one-2 days  Brendia Sacks, MD  [facility] Team 7 Pager [phone] If 7PM-7AM, please contact night-coverage at [email or web address], password [facility] 09/07/2012, 8:24 AM  LOS: 1 day   Brief narrative: 79 y.o. male who presents to the ED with AMS and delerium onset earlier this evening at [facility]. Per his family and him he always gets delerium when ever he runs a fever (usually from a UTI). He has had a "chest cold" for the past few days including productive cough.  In the ED he was initially delirious with fever of 101.5, he was initially tachycardic and mildly hypotensive as well, thankfully after treatment with fluids, rocephin, and tylenol, his fever, tachycardia, and delirium have all apparently resolved and he is now quite alert and oriented. CXR demonstrated bibasilar atelectasis vs infiltrates, hospitalist has been  asked to admit for Sepsis from a presumed CAP.  Consultants:    Procedures:    Antibiotics:  Zithromax 2/19 >> 2/19  Ceftriaxone 2/19 >> 2/19  Levaquin 2/20 >> 2/25  HPI/Subjective: Afebrile, vital signs stable. Hypertensive today.  No hypoxia or tachypnea. Feels better. Does not recall events of yesterday.  Objective: Filed Vitals:   09/07/12 0100 09/07/12 0121 09/07/12 0145 09/07/12 0600  BP: 115/63  132/66 153/82  Pulse: 82  79 72  Temp:  99.1 F (37.3 C) 99 F (37.2 C) 98.4 F (36.9 C)  TempSrc:  Oral    Resp:   14 18  Height:   5' 9.5" (1.765 m)   Weight:   87.2 kg (192 lb 3.9 oz)   SpO2: 95%  95% 96%    Intake/Output Summary (Last 24 hours) at 09/07/12 0824 Last data filed at 09/07/12 0500  Gross per 24 hour  Intake      0 ml  Output    260 ml  Net   -260 ml   Filed Weights   09/07/12 0145  Weight: 87.2 kg (192 lb 3.9 oz)    Exam:  General:  Appears calm and comfortable, well-appearing. Able to sit in up in bed without assistance. Cardiovascular: RRR, no m/r/g. No LE edema. Telemetry: SR, no arrhythmias  Respiratory: CTA bilaterally, no w/r/r. Normal respiratory effort. Abdomen: soft, ntnd Musculoskeletal: grossly normal tone BUE/BLE Psychiatric: grossly normal mood and affect, speech fluent and appropriate. Oriented to self, month, hospital; no year Neurologic: grossly non-focal.  Data Reviewed: Basic Metabolic Panel:  Recent Labs Lab 09/06/12 2023 09/07/12 0530  NA 138 141  K 3.1* 3.2*  CL 103 108  CO2 22 24  GLUCOSE 133* 104*  BUN 13 12  CREATININE 1.20 1.26  CALCIUM 9.4 8.5  Liver Function Tests:  Recent Labs Lab 09/06/12 2023  AST 47*  ALT 39  ALKPHOS 59  BILITOT 0.4  PROT 6.5  ALBUMIN 3.3*   CBC:  Recent Labs Lab 09/06/12 2023 09/07/12 0530  WBC 8.7 6.3  NEUTROABS 7.0  --   HGB 14.5 12.9*  HCT 40.1 36.2*  MCV 90.7 91.9  PLT 146* 124*    Studies: Dg Chest Port 1 View  09/06/2012  *RADIOLOGY REPORT*   Clinical Data: Fever.  PORTABLE CHEST - 1 VIEW  Comparison: Two-view chest 01/03/2011.  Findings: Low lung volumes exaggerate the heart size.  New bibasilar airspace disease is present, worse on the left.  The visualized soft tissues and bony thorax are unremarkable.  IMPRESSION:  1.  Low lung volumes with new bibasilar airspace disease, worse on the left.  While this likely reflects atelectasis, infection is not excluded.   Original Report Authenticated By: Marin Roberts, M.D.     Scheduled Meds: . amitriptyline  20 mg Oral QHS  . aspirin EC  81 mg Oral Daily  . azithromycin  500 mg Oral Q24H  . calcium carbonate  1 tablet Oral Daily  . cefTRIAXone (ROCEPHIN)  IV  1 g Intravenous Q24H  . heparin  5,000 Units Subcutaneous Q8H  . levothyroxine  100 mcg Oral QAC breakfast  . memantine  10 mg Oral BID  . omega-3 acid ethyl esters  1 g Oral BID  . pantoprazole  40 mg Oral Daily  . Tamsulosin HCl  0.4 mg Oral QPC breakfast  . vitamin B-12  250 mcg Oral Daily   Continuous Infusions: . sodium chloride 1,000 mL (09/06/12 2118)    Principal Problem:   CAP (community acquired pneumonia) Active Problems:   Sepsis(995.91)   Acute encephalopathy   Thrombocytopenia, unspecified   Dementia   Unspecified hypothyroidism   Essential hypertension, benign   Right bundle branch block     Brendia Sacks, MD  [facility] Team 7 Pager [phone] If 7PM-7AM, please contact night-coverage at [email or web address], password [facility] 09/07/2012, 8:24 AM  LOS: 1 day   Time spent: 20 minutes

## 2012-09-08 ENCOUNTER — Encounter (HOSPITAL_COMMUNITY): Payer: Self-pay | Admitting: *Deleted

## 2012-09-08 LAB — CBC
Hemoglobin: 12.6 g/dL — ABNORMAL LOW (ref 13.0–17.0)
MCHC: 35 g/dL (ref 30.0–36.0)
RBC: 3.92 MIL/uL — ABNORMAL LOW (ref 4.22–5.81)

## 2012-09-08 LAB — URINE CULTURE: Colony Count: 9000

## 2012-09-08 MED ORDER — LEVOFLOXACIN 750 MG PO TABS: 750.0000 mg | ORAL_TABLET | ORAL | Status: DC

## 2012-09-08 NOTE — Discharge Summary (Signed)
Physician Discharge Summary  [name]:[id number] DOB: [birth year] DOA: 09/06/2012  PCP: No primary provider on file.  Admit date: 09/06/2012 Discharge date: 09/08/2012  Recommendations for Outpatient Follow-up:  1. followup resolution of community acquired pneumonia   Discharge Diagnoses:  1. Community acquired pneumonia with sepsis 2. Acute encephalopathy  Discharge Condition: improved Disposition: home with son  Diet recommendation: regular  Filed Weights   09/07/12 0145 09/08/12 0616  Weight: 87.2 kg (192 lb 3.9 oz) 86.909 kg (191 lb 9.6 oz)    History of present illness:  79 y.o. male who presents to the ED with AMS and delerium onset earlier this evening at [facility]. Per his family and him he always gets delerium when ever he runs a fever (usually from a UTI). He has had a "chest cold" for the past few days including productive cough.   In the ED he was initially delirious with fever of 101.5, he was initially tachycardic and mildly hypotensive as well, thankfully after treatment with fluids, rocephin, and tylenol, his fever, tachycardia, and delirium have all apparently resolved and he is now quite alert and oriented. CXR demonstrated bibasilar atelectasis vs infiltrates, hospitalist has been asked to admit for Sepsis from a presumed CAP.  Hospital Course:  [name] was admitted for treatment of community acquired pneumonia with sepsis. This rapidly improved with empiric antibiotic therapy in acute encephalopathy associated with infection resolved as well. Patient is afebrile with no oxygen requirement stable for discharge. Mentally he is at baseline per son.  1. Suspected community acquired pneumonia with sepsis: As suggested by history of cough although chest x-ray was somewhat equivocal. Resolution of delirium, fever and hypotension. No hypoxia. continue Levaquin. 2. Acute encephalopathy superimposed on dementia: Secondary to acute illness. Resolved.at baseline per  son. 3. Mild thrombocytopenia: Likely secondary to sepsis. Stable. 4. Right bundle branch block: Chronic. Seen on EKG 01/04/2011. 5. History frequent UTIs to cause delirium 6. Hypertension: remains mildly hypertensive.continue chronic medications. 7. BPH: Continue Rapaflo. 8. Hypothyroidism: Continue Synthroid.   9. Dementia: Stable. Continue Namenda.  Consultants:  none  Procedures:  none  Antibiotics:  Zithromax 2/19 >> 2/19  Ceftriaxone 2/19 >> 2/19  Levaquin 2/20 >> 2/25   Discharge Instructions  Discharge Orders   Future Orders Complete By Expires     Diet general  As directed     Discharge instructions  As directed     Comments:      Be sure to finish Levaquin (antibiotic) for pneumonia. Call physician or seek immediate medical attention for fever, increased shortness of breath or worsening of condition. Followup with your primary care physician in one to 2 weeks.    Increase activity slowly  As directed         Medication List    TAKE these medications       acetaminophen 500 MG tablet  Commonly known as:  TYLENOL  Take 500 mg by mouth 2 (two) times daily.     amitriptyline 10 MG tablet  Commonly known as:  ELAVIL  Take 20 mg by mouth at bedtime.     aspirin EC 81 MG tablet  Take 81 mg by mouth daily.     calcium carbonate 600 MG Tabs  Commonly known as:  OS-CAL  Take 600 mg by mouth daily.     fish oil-omega-3 fatty acids 1000 MG capsule  Take 1 g by mouth 2 (two) times daily.     hydrochlorothiazide 25 MG tablet  Commonly known as:  HYDRODIURIL  Take 25 mg by mouth daily.     levofloxacin 750 MG tablet  Commonly known as:  LEVAQUIN  Take 1 tablet (750 mg total) by mouth every other day. Start 2/22 in the evening.  Start taking on:  09/09/2012     levothyroxine 100 MCG tablet  Commonly known as:  SYNTHROID, LEVOTHROID  Take 100 mcg by mouth daily.     memantine 10 MG tablet  Commonly known as:  NAMENDA  Take 10 mg by mouth 2 (two) times  daily.     metoprolol succinate 25 MG 24 hr tablet  Commonly known as:  TOPROL-XL  Take 25 mg by mouth daily.     omeprazole 20 MG capsule  Commonly known as:  PRILOSEC  Take 20 mg by mouth daily.     silodosin 8 MG Caps capsule  Commonly known as:  RAPAFLO  Take 8 mg by mouth daily with breakfast.     vitamin B-12 250 MCG tablet  Commonly known as:  CYANOCOBALAMIN  Take 250 mcg by mouth daily.          The results of significant diagnostics from this hospitalization (including imaging, microbiology, ancillary and laboratory) are listed below for reference.    Significant Diagnostic Studies: Dg Chest Port 1 View  09/06/2012  *RADIOLOGY REPORT*  Clinical Data: Fever.  PORTABLE CHEST - 1 VIEW  Comparison: Two-view chest 01/03/2011.  Findings: Low lung volumes exaggerate the heart size.  New bibasilar airspace disease is present, worse on the left.  The visualized soft tissues and bony thorax are unremarkable.  IMPRESSION:  1.  Low lung volumes with new bibasilar airspace disease, worse on the left.  While this likely reflects atelectasis, infection is not excluded.   Original Report Authenticated By: Marin Roberts, M.D.     Microbiology: Recent Results (from the past 240 hour(s))  CULTURE, BLOOD (ROUTINE X 2)     Status: None   Collection Time    09/06/12  8:10 PM      Result Value Range Status   Specimen Description BLOOD HAND RIGHT   Final   Special Requests BOTTLES DRAWN AEROBIC AND ANAEROBIC 10CC   Final   Culture  Setup Time 09/07/2012 03:42   Final   Culture     Final   Value:        BLOOD CULTURE RECEIVED NO GROWTH TO DATE CULTURE WILL BE HELD FOR 5 DAYS BEFORE ISSUING A FINAL NEGATIVE REPORT   Report Status PENDING   Incomplete  CULTURE, BLOOD (ROUTINE X 2)     Status: None   Collection Time    09/06/12  8:20 PM      Result Value Range Status   Specimen Description BLOOD HAND RIGHT   Final   Special Requests BOTTLES DRAWN AEROBIC AND ANAEROBIC 10CC   Final    Culture  Setup Time 09/07/2012 03:42   Final   Culture     Final   Value:        BLOOD CULTURE RECEIVED NO GROWTH TO DATE CULTURE WILL BE HELD FOR 5 DAYS BEFORE ISSUING A FINAL NEGATIVE REPORT   Report Status PENDING   Incomplete  URINE CULTURE     Status: None   Collection Time    09/06/12  8:40 PM      Result Value Range Status   Specimen Description URINE, RANDOM   Final   Special Requests ADDED [id number]   Final   Culture  Setup Time 09/07/2012  09:32   Final   Colony Count 9,000 COLONIES/ML   Final   Culture INSIGNIFICANT GROWTH   Final   Report Status 09/08/2012 FINAL   Final  CLOSTRIDIUM DIFFICILE BY PCR     Status: None   Collection Time    09/07/12 11:26 AM      Result Value Range Status   C difficile by pcr NEGATIVE  NEGATIVE Final     Labs: Basic Metabolic Panel:  Recent Labs Lab 09/06/12 2023 09/07/12 0530  NA 138 141  K 3.1* 3.2*  CL 103 108  CO2 22 24  GLUCOSE 133* 104*  BUN 13 12  CREATININE 1.20 1.26  CALCIUM 9.4 8.5   Liver Function Tests:  Recent Labs Lab 09/06/12 2023  AST 47*  ALT 39  ALKPHOS 59  BILITOT 0.4  PROT 6.5  ALBUMIN 3.3*   CBC:  Recent Labs Lab 09/06/12 2023 09/07/12 0530 09/08/12 0605  WBC 8.7 6.3 7.0  NEUTROABS 7.0  --   --   HGB 14.5 12.9* 12.6*  HCT 40.1 36.2* 36.0*  MCV 90.7 91.9 91.8  PLT 146* 124* 123*    Principal Problem:   CAP (community acquired pneumonia) Active Problems:   Sepsis(995.91)   Acute encephalopathy   Thrombocytopenia, unspecified   Dementia   Unspecified hypothyroidism   Essential hypertension, benign   Right bundle branch block   Time coordinating discharge: 25 minutes  Signed:  Brendia Sacks, MD [facility] 09/08/2012, 7:03 PM

## 2012-09-08 NOTE — Progress Notes (Signed)
[facility] PROGRESS NOTE  KAIDON KINKER ZOX:[id number] DOB: 01/31/1922 DOA: 09/06/2012 PCP: No primary provider on file.  Assessment/Plan: 1. Suspected community acquired pneumonia with sepsis: As suggested by history of cough although chest x-ray was somewhat equivocal. Resolution of delirium, fever and hypotension. No hypoxia. continue Levaquin. 2. Acute encephalopathy superimposed on dementia: Secondary to acute illness. Resolved.at baseline per son. 3. Mild thrombocytopenia: Likely secondary to sepsis. Stable. 4. Right bundle branch block: Chronic. Seen on EKG 01/04/2011. 5. History frequent UTIs to cause delirium 6. Hypertension: remains mildly hypertensive.continue chronic medications. 7. BPH: Continue Rapaflo. 8. Hypothyroidism: Continue Synthroid.  9. Dementia: Stable. Continue Namenda.  Code Status: Full code  Family Communication: As discussed with son Penni Bombard by telephone,including diagnosis and plan Disposition Plan: Home   Brendia Sacks, MD  [facility] Team 7 Pager [phone] If 7PM-7AM, please contact night-coverage at [email or web address], password [facility] 09/08/2012, 6:18 PM  LOS: 2 days   Brief narrative: 79 y.o. male who presents to the ED with AMS and delerium onset earlier this evening at [facility]. Per his family and him he always gets delerium when ever he runs a fever (usually from a UTI). He has had a "chest cold" for the past few days including productive cough.   In the ED he was initially delirious with fever of 101.5, he was initially tachycardic and mildly hypotensive as well, thankfully after treatment with fluids, rocephin, and tylenol, his fever, tachycardia, and delirium have all apparently resolved and he is now quite alert and oriented. CXR demonstrated bibasilar atelectasis vs infiltrates, hospitalist has been asked to admit for Sepsis from a presumed CAP.  Consultants:  none  Procedures:  none  Antibiotics:  Zithromax 2/19 >>  2/19  Ceftriaxone 2/19 >> 2/19  Levaquin 2/20 >> 2/25  HPI/Subjective: Afebrile, vital signs stable. Feels much better. No complaints except some cough.  Objective: Filed Vitals:   09/07/12 0600 09/07/12 1309 09/08/12 0616 09/08/12 1700  BP: 153/82 154/68 160/83 144/80  Pulse: 72 81 83 77  Temp: 98.4 F (36.9 C) 97.9 F (36.6 C) 97.9 F (36.6 C) 97.4 F (36.3 C)  TempSrc:  Oral Oral Oral  Resp: 18 18 20 18   Height:      Weight:   86.909 kg (191 lb 9.6 oz)   SpO2: 96% 96% 92% 96%    Intake/Output Summary (Last 24 hours) at 09/08/12 1818 Last data filed at 09/08/12 0700  Gross per 24 hour  Intake    120 ml  Output    850 ml  Net   -730 ml   Filed Weights   09/07/12 0145 09/08/12 0616  Weight: 87.2 kg (192 lb 3.9 oz) 86.909 kg (191 lb 9.6 oz)    Exam:  General:  Appears calm and comfortable, well-appearing. Ambulates without assistance. Cardiovascular: RRR, no m/r/g. No LE edema. Respiratory: CTA bilaterally, no w/r/r. Normal respiratory effort. Psychiatric: grossly normal mood and affect, speech fluent and appropriate. Oriented to self, hospital; not year  Data Reviewed: Basic Metabolic Panel:  Recent Labs Lab 09/06/12 2023 09/07/12 0530  NA 138 141  K 3.1* 3.2*  CL 103 108  CO2 22 24  GLUCOSE 133* 104*  BUN 13 12  CREATININE 1.20 1.26  CALCIUM 9.4 8.5   Liver Function Tests:  Recent Labs Lab 09/06/12 2023  AST 47*  ALT 39  ALKPHOS 59  BILITOT 0.4  PROT 6.5  ALBUMIN 3.3*   CBC:  Recent Labs Lab 09/06/12 2023 09/07/12 0530 09/08/12 0605  WBC  8.7 6.3 7.0  NEUTROABS 7.0  --   --   HGB 14.5 12.9* 12.6*  HCT 40.1 36.2* 36.0*  MCV 90.7 91.9 91.8  PLT 146* 124* 123*    Studies: Dg Chest Port 1 View  09/06/2012  *RADIOLOGY REPORT*  Clinical Data: Fever.  PORTABLE CHEST - 1 VIEW  Comparison: Two-view chest 01/03/2011.  Findings: Low lung volumes exaggerate the heart size.  New bibasilar airspace disease is present, worse on the left.  The  visualized soft tissues and bony thorax are unremarkable.  IMPRESSION:  1.  Low lung volumes with new bibasilar airspace disease, worse on the left.  While this likely reflects atelectasis, infection is not excluded.   Original Report Authenticated By: Marin Roberts, M.D.     Scheduled Meds: . amitriptyline  20 mg Oral QHS  . aspirin EC  81 mg Oral Daily  . calcium carbonate  1 tablet Oral Daily  . cholestyramine  4 g Oral BID AC  . heparin  5,000 Units Subcutaneous Q8H  . levofloxacin (LEVAQUIN) IV  750 mg Intravenous Q48H  . levothyroxine  100 mcg Oral QAC breakfast  . memantine  10 mg Oral BID  . omega-3 acid ethyl esters  1 g Oral BID  . pantoprazole  40 mg Oral Daily  . Tamsulosin HCl  0.4 mg Oral QPC breakfast  . vitamin B-12  250 mcg Oral Daily   Continuous Infusions:    Principal Problem:   CAP (community acquired pneumonia) Active Problems:   Sepsis(995.91)   Acute encephalopathy   Thrombocytopenia, unspecified   Dementia   Unspecified hypothyroidism   Essential hypertension, benign   Right bundle branch block     Brendia Sacks, MD  [facility] Team 7 Pager [phone] If 7PM-7AM, please contact night-coverage at [email or web address], password [facility] 09/08/2012, 6:18 PM  LOS: 2 days

## 2012-09-13 LAB — CULTURE, BLOOD (ROUTINE X 2)
Culture: NO GROWTH
Culture: NO GROWTH

## 2014-01-10 ENCOUNTER — Ambulatory Visit: Payer: Self-pay | Admitting: Podiatrist

## 2016-06-30 ENCOUNTER — Emergency Department (HOSPITAL_COMMUNITY): Payer: Medicare Other

## 2016-06-30 ENCOUNTER — Emergency Department (HOSPITAL_COMMUNITY)
Admission: EM | Admit: 2016-06-30 | Discharge: 2016-06-30 | Disposition: A | Discharge status: Home / Self Care | Payer: Medicare Other | Attending: Emergency Medicine | Admitting: Emergency Medicine

## 2016-06-30 ENCOUNTER — Encounter (HOSPITAL_COMMUNITY): Payer: Self-pay | Admitting: Emergency Medicine

## 2016-06-30 DIAGNOSIS — Z87891 Personal history of nicotine dependence: Secondary | ICD-10-CM | POA: Insufficient documentation

## 2016-06-30 DIAGNOSIS — I129 Hypertensive chronic kidney disease with stage 1 through stage 4 chronic kidney disease, or unspecified chronic kidney disease: Secondary | ICD-10-CM | POA: Insufficient documentation

## 2016-06-30 DIAGNOSIS — R52 Pain, unspecified: Secondary | ICD-10-CM

## 2016-06-30 DIAGNOSIS — G309 Alzheimer's disease, unspecified: Secondary | ICD-10-CM | POA: Diagnosis not present

## 2016-06-30 DIAGNOSIS — Y939 Activity, unspecified: Secondary | ICD-10-CM | POA: Insufficient documentation

## 2016-06-30 DIAGNOSIS — Y999 Unspecified external cause status: Secondary | ICD-10-CM | POA: Diagnosis not present

## 2016-06-30 DIAGNOSIS — S20212A Contusion of left front wall of thorax, initial encounter: Secondary | ICD-10-CM

## 2016-06-30 DIAGNOSIS — S299XXA Unspecified injury of thorax, initial encounter: Secondary | ICD-10-CM | POA: Diagnosis present

## 2016-06-30 DIAGNOSIS — R296 Repeated falls: Secondary | ICD-10-CM | POA: Diagnosis not present

## 2016-06-30 DIAGNOSIS — M549 Dorsalgia, unspecified: Secondary | ICD-10-CM

## 2016-06-30 DIAGNOSIS — W19XXXA Unspecified fall, initial encounter: Secondary | ICD-10-CM | POA: Diagnosis not present

## 2016-06-30 DIAGNOSIS — E039 Hypothyroidism, unspecified: Secondary | ICD-10-CM | POA: Insufficient documentation

## 2016-06-30 DIAGNOSIS — N183 Chronic kidney disease, stage 3 (moderate): Secondary | ICD-10-CM | POA: Diagnosis not present

## 2016-06-30 DIAGNOSIS — Y929 Unspecified place or not applicable: Secondary | ICD-10-CM | POA: Diagnosis not present

## 2016-06-30 DIAGNOSIS — Z79899 Other long term (current) drug therapy: Secondary | ICD-10-CM | POA: Insufficient documentation

## 2016-06-30 HISTORY — DX: Unspecified dementia, unspecified severity, without behavioral disturbance, psychotic disturbance, mood disturbance, and anxiety: F03.90

## 2016-06-30 HISTORY — DX: Hyperlipidemia, unspecified: E78.5

## 2016-06-30 HISTORY — DX: Essential (primary) hypertension: I10

## 2016-06-30 HISTORY — DX: Dementia in other diseases classified elsewhere, unspecified severity, without behavioral disturbance, psychotic disturbance, mood disturbance, and anxiety: F02.80

## 2016-06-30 HISTORY — DX: Gastro-esophageal reflux disease without esophagitis: K21.9

## 2016-06-30 HISTORY — DX: Alzheimer's disease, unspecified: G30.9

## 2016-06-30 HISTORY — DX: Chronic kidney disease, stage 3 (moderate): N18.3

## 2016-06-30 HISTORY — DX: Chronic kidney disease, stage 3 unspecified: N18.30

## 2016-06-30 HISTORY — DX: Unspecified osteoarthritis, unspecified site: M19.90

## 2016-06-30 LAB — BASIC METABOLIC PANEL
ANION GAP: 8 (ref 5–15)
BUN: 15 mg/dL (ref 6–20)
CALCIUM: 9.2 mg/dL (ref 8.9–10.3)
CO2: 24 mmol/L (ref 22–32)
Chloride: 100 mmol/L — ABNORMAL LOW (ref 101–111)
Creatinine, Ser: 1.23 mg/dL (ref 0.61–1.24)
GFR, EST AFRICAN AMERICAN: 57 mL/min — AB (ref >60)
GFR, EST NON AFRICAN AMERICAN: 49 mL/min — AB (ref >60)
GLUCOSE: 117 mg/dL — AB (ref 65–99)
POTASSIUM: 3.5 mmol/L (ref 3.5–5.1)
Sodium: 132 mmol/L — ABNORMAL LOW (ref 135–145)

## 2016-06-30 LAB — URINALYSIS, ROUTINE W REFLEX MICROSCOPIC
Bilirubin Urine: NEGATIVE
GLUCOSE, UA: NEGATIVE mg/dL
Hgb urine dipstick: NEGATIVE
KETONES UR: 5 mg/dL — AB
Leukocytes, UA: NEGATIVE
NITRITE: NEGATIVE
PROTEIN: NEGATIVE mg/dL
Specific Gravity, Urine: 1.023 (ref 1.005–1.030)
pH: 5 (ref 5.0–8.0)

## 2016-06-30 LAB — CBC WITH DIFFERENTIAL/PLATELET
BASOS ABS: 0 10*3/uL (ref 0.0–0.1)
BASOS PCT: 0 %
Eosinophils Absolute: 0.1 10*3/uL (ref 0.0–0.7)
Eosinophils Relative: 1 %
HEMATOCRIT: 41 % (ref 39.0–52.0)
Hemoglobin: 14.2 g/dL (ref 13.0–17.0)
LYMPHS PCT: 11 %
Lymphs Abs: 1 10*3/uL (ref 0.7–4.0)
MCH: 32.8 pg (ref 26.0–34.0)
MCHC: 34.6 g/dL (ref 30.0–36.0)
MCV: 94.7 fL (ref 78.0–100.0)
Monocytes Absolute: 1.2 10*3/uL — ABNORMAL HIGH (ref 0.1–1.0)
Monocytes Relative: 14 %
NEUTROS ABS: 6.7 10*3/uL (ref 1.7–7.7)
Neutrophils Relative %: 74 %
PLATELETS: 182 10*3/uL (ref 150–400)
RBC: 4.33 MIL/uL (ref 4.22–5.81)
RDW: 13.7 % (ref 11.5–15.5)
WBC: 8.9 10*3/uL (ref 4.0–10.5)

## 2016-06-30 MED ORDER — DICLOFENAC SODIUM 1 % TD GEL
4.0000 g | Freq: Once | TRANSDERMAL | Status: AC
  Administered 2016-06-30: 13:00:00 4 g via TOPICAL
  Filled 2016-06-30: qty 100

## 2016-06-30 MED ORDER — LEVOFLOXACIN 750 MG PO TABS: 750.0000 mg | ORAL_TABLET | Freq: Every day | ORAL | 0 refills | Status: DC

## 2016-06-30 MED ORDER — HYDROCODONE-ACETAMINOPHEN 5-325 MG PO TABS: ORAL_TABLET | ORAL | 0 refills | Status: DC

## 2016-06-30 MED ORDER — HYDROCODONE-ACETAMINOPHEN 5-325 MG PO TABS
1.0000 | ORAL_TABLET | Freq: Once | ORAL | Status: AC
  Administered 2016-06-30: 1 via ORAL
  Filled 2016-06-30: qty 1

## 2016-06-30 MED ORDER — ALBUTEROL SULFATE (2.5 MG/3ML) 0.083% IN NEBU
5.0000 mg | INHALATION_SOLUTION | Freq: Once | RESPIRATORY_TRACT | Status: AC
  Administered 2016-06-30: 5 mg via RESPIRATORY_TRACT
  Filled 2016-06-30: qty 6

## 2016-06-30 MED ORDER — LEVOFLOXACIN 750 MG PO TABS
750.0000 mg | ORAL_TABLET | Freq: Once | ORAL | Status: AC
  Administered 2016-06-30: 750 mg via ORAL
  Filled 2016-06-30: qty 1

## 2016-06-30 NOTE — Discharge Instructions (Signed)
Take vicodin for breakthrough pain, do not drink alcohol, drive, care for children or do other critical tasks while taking vicodin. ° °Please be very careful not to fall! °The pain medication puts you at risk for falls. Please rest as much as possible and try to not stay alone.  ° °Please follow with your primary care doctor in the next 2 days for a check-up. They must obtain records for further management.  ° °Do not hesitate to return to the Emergency Department for any new, worsening or concerning symptoms.  ° ° °

## 2016-06-30 NOTE — ED Provider Notes (Signed)
PROGRESS NOTE                                                                                                                 This is a sign-out from NP Tomasa BlaseSchultz at shift change: [name] is a 82 y.o. male with past medical history significant for dementia, patient with no complaints. Level V caveat secondary to dementia. As per SNF patient had several falls over the course of the last day. Tender to palpation along the left chest and thoracic spine. Plan is to follow-up plane films. Please refer to previous note for full HPI, ROS, PMH and PE.   Imaging negative for fracture, there is a patchy density over the right midlung field could be chronic versus developing infiltrate, patient does have a history of pneumonia. Will start him on H Regimen of Levaquin.  Discussed plan with his medical POA, daughter-in-law at bedside and she requests that the patient be admitted to the hospital, she is [facility] that [facility] Would like to check basic blood work and urinalysis however patient adamantly declines this, discussed with medical POA who states that he can be discharged back to SNF. Patient failed trial of ambulation, he will now agree to blood work and urinalysis.   PCP: [facility] declines admission, requests PT evaluation  Physical therapy is evaluated this patient and deemed him stable for discharge to home, urinalysis is without signs of infection. Discussed with daughter-in-law who is medical POA who requests that the admitting doctor evaluate the patient and speak to her. Stress with [facility] hospital[facility] Dr. Jacques EarthlyNettie who will evaluate this patient.  Case signed out to PA Harris at shift change.   Wynetta Emeryicole Mata Rowen, PA-C 06/30/16 1552    Maia PlanJoshua G Long, MD 07/01/16 [phone]

## 2016-06-30 NOTE — ED Notes (Signed)
Patient was unable to ambulate at this time due to the pain in his back.

## 2016-06-30 NOTE — ED Notes (Signed)
CASE MANAGER ANS SOCIAL WORKER PRESENT SPEAKING WITH DAUGHTER AND PT

## 2016-06-30 NOTE — ED Notes (Signed)
PTAR VITALS- 162/82, HR 108, 02 SAT 94%, RR 18 AT DISCHARGE

## 2016-06-30 NOTE — ED Notes (Signed)
PT AT BEDSIDE WALKING PT WITH Dan HumphreysWALKER

## 2016-06-30 NOTE — ED Provider Notes (Signed)
[facility]-EMERGENCY DEPT Provider Note   CSN: [id number] Arrival date & time: 06/30/16  0459     History   Chief Complaint Chief Complaint  Patient presents with  . Back Pain  . Fall    HPI [name] is a 82 y.o. male.  This a 82 year old male sent in from nursing home after having 3 falls within the past 24 hours.  Patient is complaining of left rib and upper back pain.  He's been given Tylenol and is unsure if this helps or not      Past Medical History:  Diagnosis Date  . Alzheimer disease   . CKD (chronic kidney disease) stage 3, GFR 30-59 ml/min   . Dementia   . GERD (gastroesophageal reflux disease)   . Hyperlipidemia   . Hypertension   . Kidney stones   . Osteoarthritis   . UTI (lower urinary tract infection)    frequent    Patient Active Problem List   Diagnosis Date Noted  . CAP (community acquired pneumonia) 09/07/2012  . Sepsis(995.91) 09/07/2012  . Acute encephalopathy 09/07/2012  . Thrombocytopenia, unspecified 09/07/2012  . Dementia 09/07/2012  . Unspecified hypothyroidism 09/07/2012  . Essential hypertension, benign 09/07/2012  . Right bundle branch block 09/07/2012    History reviewed. No pertinent surgical history.     Home Medications    Prior to Admission medications   Medication Sig Start Date End Date Taking? Authorizing Provider  acetaminophen (TYLENOL) 500 MG tablet Take 500 mg by mouth every 6 (six) hours as needed for mild pain.    Yes Historical Provider, MD  calcium-vitamin D (OSCAL WITH D) 500-200 MG-UNIT tablet Take 1 tablet by mouth daily with breakfast.   Yes Historical Provider, MD  divalproex (DEPAKOTE) 125 MG DR tablet Take 1 tablet by mouth 3 (three) times daily. 06/23/16  Yes Historical Provider, MD  Glucosamine 500 MG CAPS Take 3 capsules by mouth daily.   Yes Historical Provider, MD  hydrochlorothiazide (HYDRODIURIL) 12.5 MG tablet Take 12.5 mg by mouth daily.   Yes Historical Provider, MD  levothyroxine (SYNTHROID,  LEVOTHROID) 100 MCG tablet Take 100 mcg by mouth daily.   Yes Historical Provider, MD  loperamide (IMODIUM) 2 MG capsule Take 2 mg by mouth as needed for diarrhea or loose stools.   Yes Historical Provider, MD  Melatonin 10 MG TABS Take 1 tablet by mouth at bedtime.   Yes Historical Provider, MD  memantine (NAMENDA) 10 MG tablet Take 10 mg by mouth 2 (two) times daily.   Yes Historical Provider, MD  metoprolol succinate (TOPROL-XL) 25 MG 24 hr tablet Take 25 mg by mouth daily.   Yes Historical Provider, MD  potassium chloride (KLOR-CON) 20 MEQ packet Take 20 mEq by mouth daily.   Yes Historical Provider, MD  sertraline (ZOLOFT) 50 MG tablet Take 50 mg by mouth daily.   Yes Historical Provider, MD  tamsulosin (FLOMAX) 0.4 MG CAPS capsule Take 0.4 mg by mouth daily.   Yes Historical Provider, MD  trimethoprim (TRIMPEX) 100 MG tablet Take 100 mg by mouth daily.   Yes Historical Provider, MD  HYDROcodone-acetaminophen (NORCO/VICODIN) 5-325 MG tablet Take 1-2 tablets by mouth every 6 hours as needed for pain and/or cough. 06/30/16   Nicole Pisciotta, PA-C  levofloxacin (LEVAQUIN) 750 MG tablet Take 1 tablet (750 mg total) by mouth daily. 06/30/16   Joni Reining Pisciotta, PA-C    Family History History reviewed. No pertinent family history.  Social History Social History  Substance Use Topics  .  Smoking status: Former Games developermoker  . Smokeless tobacco: Never Used  . Alcohol use No     Allergies   Codeine   Review of Systems Review of Systems  Constitutional: Negative for fever.  Respiratory: Negative for shortness of breath.   Cardiovascular: Positive for chest pain.  Musculoskeletal: Positive for back pain.  Neurological: Negative for dizziness, speech difficulty, weakness and headaches.  All other systems reviewed and are negative.    Physical Exam Updated Vital Signs BP 148/80   Pulse 83   Temp 98.4 F (36.9 C) (Oral)   Resp 18   Ht 5\' 9"  (1.753 m)   Wt 79.4 kg   SpO2 92%   BMI  25.84 kg/m   Physical Exam  Constitutional: He appears well-developed and well-nourished.  HENT:  Head: Normocephalic.  Eyes: Pupils are equal, round, and reactive to light.  Neck: Normal range of motion.  Cardiovascular: Normal rate.   Pulmonary/Chest: Effort normal.  Abdominal: Soft.  Musculoskeletal: Normal range of motion. He exhibits tenderness.       Back:  Neurological: He is alert.  Skin: Skin is warm and dry.  Nursing note and vitals reviewed.    ED Treatments / Results  Labs (all labs ordered are listed, but only abnormal results are displayed) Labs Reviewed  CBC WITH DIFFERENTIAL/PLATELET - Abnormal; Notable for the following:       Result Value   Monocytes Absolute 1.2 (*)    All other components within normal limits  BASIC METABOLIC PANEL - Abnormal; Notable for the following:    Sodium 132 (*)    Chloride 100 (*)    Glucose, Bld 117 (*)    GFR calc non Af Amer 49 (*)    GFR calc Af Amer 57 (*)    All other components within normal limits  URINALYSIS, ROUTINE W REFLEX MICROSCOPIC - Abnormal; Notable for the following:    Ketones, ur 5 (*)    All other components within normal limits    EKG  EKG Interpretation None       Radiology Dg Ribs Unilateral W/chest Left  Result Date: 06/30/2016 CLINICAL DATA:  82 year old male with fall and upper back pain. EXAM: LEFT RIBS AND CHEST - 3+ VIEW COMPARISON:  Chest radiograph dated 10/01/2015 FINDINGS: There is diffuse interstitial coarsening. An area of hazy interstitial prominence over the right mid lung field above the minor fissure as well as densities of the left lung base appear slightly more prominent since the prior radiograph. Although this may be chronic in nature developing infiltrate is not excluded. Clinical correlation is recommended. There is no pleural effusion or pneumothorax. The cardiac silhouette is within normal limits. There is atherosclerotic calcification of the aortic arch. There is  osteopenia.  No definite acute rib fracture identified. Right upper quadrant cholecystectomy clips. IMPRESSION: No definite acute rib fracture.  No pneumothorax. Patchy interstitial coarsening and hazy density over the right mid lung field and left lung base may be chronic or represent developing infiltrate. Clinical correlation recommended. Electronically Signed   By: Elgie CollardArash  Radparvar M.D.   On: 06/30/2016 06:29   Dg Thoracic Spine 2 View  Result Date: 06/30/2016 CLINICAL DATA:  82 y/o  M; status post fall. EXAM: THORACIC SPINE 2 VIEWS COMPARISON:  10/01/2015 chest radiograph. FINDINGS: There is no evidence of thoracic spine fracture. Alignment is normal. No other significant bone abnormalities are identified. Mild thoracic degenerative changes with multilevel disc space narrowing and small marginal osteophytes. Right upper quadrant surgical clips presumably  cholecystectomy IMPRESSION: No acute fracture or dislocation is identified. Electronically Signed   By: Mitzi HansenLance  Furusawa-Stratton M.D.   On: 06/30/2016 06:30   Ct Cervical Spine Wo Contrast  Result Date: 06/30/2016 CLINICAL DATA:  Frequent falls, upper back pain EXAM: CT CERVICAL SPINE WITHOUT CONTRAST TECHNIQUE: Multidetector CT imaging of the cervical spine was performed without intravenous contrast. Multiplanar CT image reconstructions were also generated. COMPARISON:  None. FINDINGS: Alignment: Normal. Skull base and vertebrae: No acute fracture. No primary bone lesion or focal pathologic process. Soft tissues and spinal canal: No prevertebral fluid or swelling. No visible canal hematoma. Disc levels: Mild degenerative disc disease with disc height loss at C3-4. Broad-based disc osteophyte complex with bilateral uncovertebral degenerative changes at C3-4 and right foraminal stenosis. At C4-5 there is a mild broad-based disc osteophyte complex, bilateral facet arthropathy and right foraminal stenosis. At C5-6 there is bilateral facet arthropathy. At  C6-7 there is mild bilateral facet arthropathy. There is atlantodental arthropathy with subcortical reactive changes. Upper chest: Lung apices are clear. Other: No fluid collection or hematoma. Bilateral carotid artery atherosclerosis. IMPRESSION: 1. No acute osseous injury the cervical spine. 2. Cervical spine spondylosis as described above. Electronically Signed   By: Elige KoHetal  Patel   On: 06/30/2016 13:32   Ct Thoracic Spine Wo Contrast  Result Date: 06/30/2016 CLINICAL DATA:  Frequent falls.  Back pain. EXAM: CT THORACIC SPINE WITHOUT CONTRAST TECHNIQUE: Multidetector CT images of the thoracic were obtained using the standard protocol without intravenous contrast. COMPARISON:  Plain films 06/30/2016 FINDINGS: Alignment: Normal Vertebrae: No acute fracture or focal pathologic process. Paraspinal and other soft tissues: Negative Disc levels: There is mild anterior and lateral spurring in the mid and lower thoracic spine. Disc spaces maintained. Other: Airspace opacities noted dependently in the lungs bilaterally. Heart appears mildly enlarged. This could reflect edema and/or atelectasis. No visible significant effusions. IMPRESSION: No acute bony abnormality in the thoracic spine. Extensive ground-glass opacities dependently in the lungs bilaterally which could reflect atelectasis and/or edema. Electronically Signed   By: Charlett NoseKevin  Dover M.D.   On: 06/30/2016 13:27   Ct Lumbar Spine Wo Contrast  Result Date: 06/30/2016 CLINICAL DATA:  Fall EXAM: CT LUMBAR SPINE WITHOUT CONTRAST TECHNIQUE: Multidetector CT imaging of the lumbar spine was performed without intravenous contrast administration. Multiplanar CT image reconstructions were also generated. COMPARISON:  None. FINDINGS: Segmentation: Fine non rib-bearing lumbar vertebral bodies. Alignment: Anatomic. Vertebrae: There is no vertebral compression deformity. There is no evidence of fracture. Paraspinal and other soft tissues: Atherosclerotic calcifications  of the aorta and iliac arteries. Several cysts are present in the kidneys Disc levels: L1-2:  Unremarkable L2-3:  Severe disc space narrowing with posterior disc osteophytes L3-4: Moderate disc space narrowing and vacuum. There is concentric disc bulge and disc osteophytes. Significant central stenosis and lateral recess stenosis is suspected. L4-5: Posterior disc bulge and ligamentum flavum hypertrophy are present. Significant central stenosis is suspected. Mild facet arthropathy. Bilateral foraminal narrowing is moderate. L5-S1: Severe disc space narrowing and vacuum. Posterior disc bulge is noted. This results in some narrowing of the lateral recesses. No central stenosis. Severe right foraminal narrowing. Moderate left foraminal narrowing. IMPRESSION: There is no vertebral compression deformity in the lumbar spine. Multilevel degenerative change is noted. There is suspected central stenosis at L3-4 and L4-5. This may be better characterize with MRI. Electronically Signed   By: Jolaine ClickArthur  Hoss M.D.   On: 06/30/2016 13:34    Procedures Procedures (including critical care time)  Medications Ordered in  ED Medications  HYDROcodone-acetaminophen (NORCO/VICODIN) 5-325 MG per tablet 1 tablet (1 tablet Oral Given 06/30/16 0605)  levofloxacin (LEVAQUIN) tablet 750 mg (750 mg Oral Given 06/30/16 0828)  albuterol (PROVENTIL) (2.5 MG/3ML) 0.083% nebulizer solution 5 mg (5 mg Nebulization Given 06/30/16 0812)  HYDROcodone-acetaminophen (NORCO/VICODIN) 5-325 MG per tablet 1 tablet (1 tablet Oral Given 06/30/16 1045)  diclofenac sodium (VOLTAREN) 1 % transdermal gel 4 g (4 g Topical Given 06/30/16 1248)     Initial Impression / Assessment and Plan / ED Course  I have reviewed the triage vital signs and the nursing notes.  Pertinent labs & imaging results that were available during my care of the patient were reviewed by me and considered in my medical decision making (see chart for details).  Clinical Course        Will xray ribs and thoracic spine  Final Clinical Impressions(s) / ED Diagnoses   Final diagnoses:  Fall, initial encounter  Contusion of left chest wall, initial encounter    New Prescriptions Discharge Medication List as of 06/30/2016  3:51 PM    START taking these medications   Details  HYDROcodone-acetaminophen (NORCO/VICODIN) 5-325 MG tablet Take 1-2 tablets by mouth every 6 hours as needed for pain and/or cough., Print         Earley Favor, NP 06/30/16 1958    Maia Plan, MD 07/01/16 [phone]

## 2016-06-30 NOTE — ED Notes (Signed)
Pt refusing lab work.  RN aware.

## 2016-06-30 NOTE — ED Notes (Signed)
DISCHARGE INSTRUCTIONS VERBALIZED TO DAUGHTER LISA POA RX X 2

## 2016-06-30 NOTE — Progress Notes (Signed)
CSW spoke with patient and daughter-in-law who states she is POA for patient. She states she and patient's son are the main support. She stated [facility] has been at [facility]. Patient states he has had frequent falls. Daughter-in-law stated patient walked out of one of the patients rooms at the facility and was found on the floor on his back. She stated patient was walking without his walker. She stated until last week, patient was doing his own ADL's, however, last week patient was moved to a higher level of care. She stated he dresses himself but they assist with hygiene. No questions noted for CSW at this time.    Posey ReaLaVonia Dany Walther, LCSWA Clinical Social Worker [phone] 11:40 AM

## 2016-06-30 NOTE — ED Notes (Signed)
Pt is unable to fully undress, due to the excessive pain when moving.

## 2016-06-30 NOTE — Evaluation (Signed)
Physical Therapy Evaluation Patient Details Name: [name] MRN: [id number] DOB: 11-20-1921 Today's Date: 06/30/2016   History of Present Illness  82 yo male admitted with falls, back pain. Imaging (-) for fx. Hx of dementia.   Clinical Impression  On eval, pt required Mod assist for bed mobility and Min assist for ambulation. He walked ~50 feet with a RW. Pt c/o back pain during session. Moderate multimodal cueing required for attention to task and participation. Pt is easily distracted and requires safety cueing. No family present during session. Recommend SNF unless ALF can provide current level of care. If pt returns to ALF, recommend HHPT trial.     Follow Up Recommendations SNF (unless ALF can provide current level of care)    Equipment Recommendations  None recommended by PT    Recommendations for Other Services       Precautions / Restrictions Precautions Precautions: Fall Restrictions Weight Bearing Restrictions: No      Mobility  Bed Mobility Overal bed mobility: Needs Assistance Bed Mobility: Sit to Supine       Sit to supine: Mod assist   General bed mobility comments: Min guard for supine to sit. Mod assist for LEs onto bed. Increased time. Multiple cues required.   Transfers Overall transfer level: Needs assistance Equipment used: Rolling walker (2 wheeled) Transfers: Sit to/from Stand Sit to Stand: Min assist         General transfer comment: Assist to rise, stabilize, control descent. Multimodal cueing required.   Ambulation/Gait Ambulation/Gait assistance: Min assist Ambulation Distance (Feet): 50 Feet Assistive device: Rolling walker (2 wheeled) Gait Pattern/deviations: Decreased step length - right;Decreased step length - left;Decreased stride length     General Gait Details: assist to stabilize pt and maneuver safely with RW. Multimodal cues required for attention to task, safety  Stairs            Wheelchair Mobility     Modified Rankin (Stroke Patients Only)       Balance Overall balance assessment: Needs assistance;History of Falls           Standing balance-Leahy Scale: Poor                               Pertinent Vitals/Pain Pain Assessment: Faces Faces Pain Scale: Hurts even more Pain Location: back Pain Intervention(s): Limited activity within patient's tolerance;Repositioned    Home Living                   Additional Comments: unsure of PLOF-no family present-pt unable to provide history    Prior Function Level of Independence: Needs assistance   Gait / Transfers Assistance Needed: ambulatory at facility with RW per chart  ADL's / Homemaking Assistance Needed: requires assistance per chart        Hand Dominance        Extremity/Trunk Assessment   Upper Extremity Assessment Upper Extremity Assessment: Overall WFL for tasks assessed    Lower Extremity Assessment Lower Extremity Assessment: Generalized weakness    Cervical / Trunk Assessment Cervical / Trunk Assessment: Kyphotic  Communication   Communication: No difficulties  Cognition Arousal/Alertness: Awake/alert Behavior During Therapy: WFL for tasks assessed/performed Overall Cognitive Status: History of cognitive impairments - at baseline                      General Comments      Exercises     Assessment/Plan  PT Assessment Patient needs continued PT services  PT Problem List Decreased strength;Decreased activity tolerance;Decreased balance;Decreased mobility;Decreased knowledge of use of DME;Decreased cognition;Pain          PT Treatment Interventions DME instruction;Gait training;Therapeutic exercise;Balance training;Functional mobility training;Therapeutic activities;Patient/family education    PT Goals (Current goals can be found in the Care Plan section)  Acute Rehab PT Goals Patient Stated Goal: none stated PT Goal Formulation: Patient unable to  participate in goal setting Time For Goal Achievement: 07/14/16 Potential to Achieve Goals: Fair    Frequency Min 2X/week   Barriers to discharge        Co-evaluation               End of Session Equipment Utilized During Treatment: Gait belt Activity Tolerance: Patient limited by pain Patient left: in bed;with call bell/phone within reach      Functional Assessment Tool Used: clinical judgement Functional Limitation: Mobility: Walking and moving around Mobility: Walking and Moving Around Current Status [phone](G8978): At least 20 percent but less than 40 percent impaired, limited or restricted Mobility: Walking and Moving Around Goal Status [phone](G8979): At least 1 percent but less than 20 percent impaired, limited or restricted    Time: [id number] PT Time Calculation (min) (ACUTE ONLY): 13 min   Charges:   PT Evaluation $PT Eval Low Complexity: 1 Procedure     PT G Codes:   PT G-Codes **NOT FOR INPATIENT CLASS** Functional Assessment Tool Used: clinical judgement Functional Limitation: Mobility: Walking and moving around Mobility: Walking and Moving Around Current Status ([phone]): At least 20 percent but less than 40 percent impaired, limited or restricted Mobility: Walking and Moving Around Goal Status [phone](G8979): At least 1 percent but less than 20 percent impaired, limited or restricted    Rebeca AlertJannie Tashona West, MPT Pager: [phone]

## 2016-06-30 NOTE — ED Notes (Signed)
Bed: WA09 Expected date:  Expected time:  Means of arrival:  Comments: 82 yo M  Multiple falls today

## 2016-06-30 NOTE — ED Triage Notes (Signed)
Patient BIB [facility] from [facility] for frequent falls and upper back pain. Pt has had 3 falls in last 24hrs. Nursing home unable to manage pain with tylenol, so sent here for evaluation. No deformities noted, no pain with palpation. Patient not on blood thinners. Does report pain with range of motion. Pt has hx of dementia.

## 2016-06-30 NOTE — Consult Note (Signed)
Went down to see patient as family is requesting admission. Patient is a 82yo male with a history of dementia with recent falls. He has back pain that impedes ambulation. Physical therapy worked with him in the ED and recommended SNF management. Discussed with HCPOA that patient does not have a medical reason for admission and will need continued physical therapy and safety management. She agreed for patient to be discharged back to SNF for continued care. Discussed with EDP for discharge.   Jacquelin Hawkingalph Laurance Heide, MD [facility] 06/30/2016, 6:33 PM Pager: [phone]

## 2016-06-30 NOTE — ED Notes (Signed)
EDPA Provider at bedside. 

## 2016-06-30 NOTE — ED Notes (Signed)
HOPITALIST Provider at bedside SPEAKING WITH PT AND FAMILY ON ADMISSION REQUIREMENTS AND POSSIBLE DISCHARGE. DAUGHTERS AWARE OF DISCHARGE. PT WILL BE DISCHARGED BY [facility]

## 2016-06-30 NOTE — ED Notes (Signed)
EDPA Provider at bedside. UPDATING PT AND DAUGHTER

## 2016-07-08 ENCOUNTER — Emergency Department (HOSPITAL_COMMUNITY)
Admission: EM | Admit: 2016-07-08 | Discharge: 2016-07-09 | Disposition: A | Discharge status: Skilled Nursing Facility | Payer: Medicare Other | Attending: Emergency Medicine | Admitting: Emergency Medicine

## 2016-07-08 ENCOUNTER — Encounter (HOSPITAL_COMMUNITY): Payer: Self-pay | Admitting: Emergency Medicine

## 2016-07-08 ENCOUNTER — Emergency Department (HOSPITAL_COMMUNITY): Payer: Medicare Other

## 2016-07-08 DIAGNOSIS — W19XXXA Unspecified fall, initial encounter: Secondary | ICD-10-CM | POA: Diagnosis not present

## 2016-07-08 DIAGNOSIS — Y999 Unspecified external cause status: Secondary | ICD-10-CM | POA: Diagnosis not present

## 2016-07-08 DIAGNOSIS — N183 Chronic kidney disease, stage 3 (moderate): Secondary | ICD-10-CM | POA: Insufficient documentation

## 2016-07-08 DIAGNOSIS — Z79899 Other long term (current) drug therapy: Secondary | ICD-10-CM | POA: Insufficient documentation

## 2016-07-08 DIAGNOSIS — Y9289 Other specified places as the place of occurrence of the external cause: Secondary | ICD-10-CM | POA: Diagnosis not present

## 2016-07-08 DIAGNOSIS — I129 Hypertensive chronic kidney disease with stage 1 through stage 4 chronic kidney disease, or unspecified chronic kidney disease: Secondary | ICD-10-CM | POA: Insufficient documentation

## 2016-07-08 DIAGNOSIS — S060X0A Concussion without loss of consciousness, initial encounter: Secondary | ICD-10-CM

## 2016-07-08 DIAGNOSIS — Z87891 Personal history of nicotine dependence: Secondary | ICD-10-CM | POA: Insufficient documentation

## 2016-07-08 DIAGNOSIS — G309 Alzheimer's disease, unspecified: Secondary | ICD-10-CM | POA: Diagnosis not present

## 2016-07-08 DIAGNOSIS — Y939 Activity, unspecified: Secondary | ICD-10-CM | POA: Insufficient documentation

## 2016-07-08 DIAGNOSIS — S0990XA Unspecified injury of head, initial encounter: Secondary | ICD-10-CM | POA: Diagnosis present

## 2016-07-08 DIAGNOSIS — E039 Hypothyroidism, unspecified: Secondary | ICD-10-CM | POA: Insufficient documentation

## 2016-07-08 DIAGNOSIS — S0003XA Contusion of scalp, initial encounter: Secondary | ICD-10-CM | POA: Diagnosis not present

## 2016-07-08 LAB — CBG MONITORING, ED: Glucose-Capillary: 110 mg/dL — ABNORMAL HIGH (ref 65–99)

## 2016-07-08 NOTE — ED Notes (Signed)
Patient transported to CT 

## 2016-07-08 NOTE — ED Provider Notes (Signed)
[facility]-EMERGENCY DEPT Provider Note   CSN: [id number] Arrival date & time: 07/08/16  2057     History   Chief Complaint Chief Complaint  Patient presents with  . Fall    HPI Rosezella FloridaJack D Ratti is a 82 y.o. male.  HPI  82 year old male with a history of Alzheimer's presents after a fall at his living facility. History is limited because the patient is demented. History is taken from the daughter at the bedside. She states that staff saw the patient seem to lose balance and fall. He did not lose consciousness. He hit the back of his head. He also had a fall this morning where there were no injuries. He's had frequent falls over the last 3 weeks, including when he was seen here on 12/13. At that time he was treated for a possible pneumonia. He also had lab work done. Patient is currently at his baseline but seemed a little more confused immediately after the fall. Is not on any blood thinners.  Past Medical History:  Diagnosis Date  . Alzheimer disease   . CKD (chronic kidney disease) stage 3, GFR 30-59 ml/min   . Dementia   . GERD (gastroesophageal reflux disease)   . Hyperlipidemia   . Hypertension   . Kidney stones   . Osteoarthritis   . UTI (lower urinary tract infection)    frequent    Patient Active Problem List   Diagnosis Date Noted  . CAP (community acquired pneumonia) 09/07/2012  . Sepsis(995.91) 09/07/2012  . Acute encephalopathy 09/07/2012  . Thrombocytopenia, unspecified 09/07/2012  . Dementia 09/07/2012  . Unspecified hypothyroidism 09/07/2012  . Essential hypertension, benign 09/07/2012  . Right bundle branch block 09/07/2012    History reviewed. No pertinent surgical history.     Home Medications    Prior to Admission medications   Medication Sig Start Date End Date Taking? Authorizing Provider  acetaminophen (TYLENOL) 500 MG tablet Take 500 mg by mouth every 6 (six) hours as needed for mild pain.     Historical Provider, MD  calcium-vitamin D (OSCAL  WITH D) 500-200 MG-UNIT tablet Take 1 tablet by mouth daily with breakfast.    Historical Provider, MD  divalproex (DEPAKOTE) 125 MG DR tablet Take 1 tablet by mouth 3 (three) times daily. 06/23/16   Historical Provider, MD  Glucosamine 500 MG CAPS Take 3 capsules by mouth daily.    Historical Provider, MD  hydrochlorothiazide (HYDRODIURIL) 12.5 MG tablet Take 12.5 mg by mouth daily.    Historical Provider, MD  HYDROcodone-acetaminophen (NORCO/VICODIN) 5-325 MG tablet Take 1-2 tablets by mouth every 6 hours as needed for pain and/or cough. 06/30/16   Nicole Pisciotta, PA-C  levofloxacin (LEVAQUIN) 750 MG tablet Take 1 tablet (750 mg total) by mouth daily. 06/30/16   Nicole Pisciotta, PA-C  levothyroxine (SYNTHROID, LEVOTHROID) 100 MCG tablet Take 100 mcg by mouth daily.    Historical Provider, MD  loperamide (IMODIUM) 2 MG capsule Take 2 mg by mouth as needed for diarrhea or loose stools.    Historical Provider, MD  Melatonin 10 MG TABS Take 1 tablet by mouth at bedtime.    Historical Provider, MD  memantine (NAMENDA) 10 MG tablet Take 10 mg by mouth 2 (two) times daily.    Historical Provider, MD  metoprolol succinate (TOPROL-XL) 25 MG 24 hr tablet Take 25 mg by mouth daily.    Historical Provider, MD  potassium chloride (KLOR-CON) 20 MEQ packet Take 20 mEq by mouth daily.    Historical Provider, MD  sertraline (ZOLOFT) 50 MG tablet Take 50 mg by mouth daily.    Historical Provider, MD  tamsulosin (FLOMAX) 0.4 MG CAPS capsule Take 0.4 mg by mouth daily.    Historical Provider, MD  trimethoprim (TRIMPEX) 100 MG tablet Take 100 mg by mouth daily.    Historical Provider, MD    Family History No family history on file.  Social History Social History  Substance Use Topics  . Smoking status: Former Games developer  . Smokeless tobacco: Never Used  . Alcohol use No     Allergies   Codeine   Review of Systems Review of Systems  Unable to perform ROS: Dementia     Physical Exam Updated Vital  Signs BP 121/70 (BP Location: Left Arm)   Pulse 73   Temp 98.2 F (36.8 C) (Oral)   Resp 11   Ht 5\' 9"  (1.753 m)   Wt 175 lb (79.4 kg)   SpO2 93%   BMI 25.84 kg/m   Physical Exam  Constitutional: He appears well-developed and well-nourished.  HENT:  Head: Normocephalic. Head is with contusion. Head is without laceration.    Right Ear: External ear normal.  Left Ear: External ear normal.  Nose: Nose normal.  Eyes: Pupils are equal, round, and reactive to light. Right eye exhibits no discharge. Left eye exhibits no discharge.  Neck: Neck supple. No spinous process tenderness and no muscular tenderness present.  Cardiovascular: Normal rate, regular rhythm and normal heart sounds.   Pulmonary/Chest: Effort normal and breath sounds normal.  Abdominal: He exhibits no distension.  Musculoskeletal: He exhibits no edema.  Normal ROM of both hips without pain  Neurological: He is alert. He is disoriented.  Awake, alert, pleasantly demented. 5/5 strength in all 4 extremities  Skin: Skin is warm and dry.  Nursing note and vitals reviewed.    ED Treatments / Results  Labs (all labs ordered are listed, but only abnormal results are displayed) Labs Reviewed  CBG MONITORING, ED - Abnormal; Notable for the following:       Result Value   Glucose-Capillary 110 (*)    All other components within normal limits    EKG  EKG Interpretation  Date/Time:  Thursday July 08 2016 21:07:59 EST Ventricular Rate:  78 PR Interval:    QRS Duration: 155 QT Interval:  410 QTC Calculation: 467 R Axis:   -64 Text Interpretation:  Normal sinus rhythm Right bundle branch block no significant change since 2014 Confirmed by Vincie Linn MD, Reannah Totten [phone]) on 07/08/2016 9:35:59 PM       Radiology Ct Head Wo Contrast  Result Date: 07/08/2016 CLINICAL DATA:  82 year old male with fall and posterior head injury. EXAM: CT HEAD WITHOUT CONTRAST TECHNIQUE: Contiguous axial images were obtained from the  base of the skull through the vertex without intravenous contrast. COMPARISON:  Head CT dated 09/07/2010 FINDINGS: Brain: There is moderate age-related atrophy and chronic microvascular ischemic changes. There is no acute intracranial hemorrhage. No mass effect or midline shift noted. No extra-axial fluid collections. Vascular: No hyperdense vessel or unexpected calcification. Skull: Normal. Negative for fracture or focal lesion. Sinuses/Orbits: No acute finding. Other: None IMPRESSION: No acute intracranial hemorrhage. Age-related atrophy and chronic microvascular ischemic disease. Electronically Signed   By: Elgie Collard M.D.   On: 07/08/2016 22:33    Procedures Procedures (including critical care time)  Medications Ordered in ED Medications - No data to display   Initial Impression / Assessment and Plan / ED Course  I have reviewed the  triage vital signs and the nursing notes.  Pertinent labs & imaging results that were available during my care of the patient were reviewed by me and considered in my medical decision making (see chart for details).  Clinical Course as of Jul 08 2340  Thu Jul 08, 2016  2155 Ct head  [SG]    Clinical Course User Index [SG] Pricilla LovelessScott Dowell Hoon, MD    Patient with increasing falls over last several weeks. He had lab work evaluated 8 days ago that was unremarkable besides very mild hyponatremia. CT does not show any significant intracranial injury. Discussed with family, they do not want further workup done and will follow-up with PCP. He has a small contusion but no other signs of trauma. No obvious signs of CVA. ECG at baseline. Discharge home.  Final Clinical Impressions(s) / ED Diagnoses   Final diagnoses:  Fall, initial encounter  Concussion without loss of consciousness, initial encounter  Contusion of scalp, initial encounter    New Prescriptions New Prescriptions   No medications on file     Pricilla LovelessScott Nga Rabon, MD 07/08/16 2350

## 2016-07-08 NOTE — ED Notes (Signed)
Per MD, family advises that patient returns via [facility].

## 2016-07-08 NOTE — ED Notes (Signed)
[facility] contacted for transport 

## 2016-07-08 NOTE — ED Triage Notes (Signed)
Per EMS pt coming [facility] had unwitnessed fall. Pt fell and was found holding the back of his head. No obvious signs of injury. Pt denies pain at this time. Pt has hx of alzheimer's dementia.

## 2016-07-13 ENCOUNTER — Emergency Department (HOSPITAL_COMMUNITY)
Admission: EM | Admit: 2016-07-13 | Discharge: 2016-07-13 | Disposition: A | Discharge status: Home / Self Care | Payer: Medicare Other | Attending: Emergency Medicine | Admitting: Emergency Medicine

## 2016-07-13 ENCOUNTER — Encounter (HOSPITAL_COMMUNITY): Payer: Self-pay | Admitting: Emergency Medicine

## 2016-07-13 DIAGNOSIS — W1811XA Fall from or off toilet without subsequent striking against object, initial encounter: Secondary | ICD-10-CM | POA: Diagnosis not present

## 2016-07-13 DIAGNOSIS — Y999 Unspecified external cause status: Secondary | ICD-10-CM | POA: Insufficient documentation

## 2016-07-13 DIAGNOSIS — S301XXA Contusion of abdominal wall, initial encounter: Secondary | ICD-10-CM | POA: Diagnosis not present

## 2016-07-13 DIAGNOSIS — S29019A Strain of muscle and tendon of unspecified wall of thorax, initial encounter: Secondary | ICD-10-CM

## 2016-07-13 DIAGNOSIS — W19XXXA Unspecified fall, initial encounter: Secondary | ICD-10-CM

## 2016-07-13 DIAGNOSIS — Z87891 Personal history of nicotine dependence: Secondary | ICD-10-CM | POA: Insufficient documentation

## 2016-07-13 DIAGNOSIS — S299XXA Unspecified injury of thorax, initial encounter: Secondary | ICD-10-CM | POA: Diagnosis present

## 2016-07-13 DIAGNOSIS — E039 Hypothyroidism, unspecified: Secondary | ICD-10-CM | POA: Insufficient documentation

## 2016-07-13 DIAGNOSIS — I129 Hypertensive chronic kidney disease with stage 1 through stage 4 chronic kidney disease, or unspecified chronic kidney disease: Secondary | ICD-10-CM | POA: Insufficient documentation

## 2016-07-13 DIAGNOSIS — S29012A Strain of muscle and tendon of back wall of thorax, initial encounter: Secondary | ICD-10-CM | POA: Diagnosis not present

## 2016-07-13 DIAGNOSIS — G309 Alzheimer's disease, unspecified: Secondary | ICD-10-CM | POA: Diagnosis not present

## 2016-07-13 DIAGNOSIS — Y9289 Other specified places as the place of occurrence of the external cause: Secondary | ICD-10-CM | POA: Diagnosis not present

## 2016-07-13 DIAGNOSIS — N183 Chronic kidney disease, stage 3 (moderate): Secondary | ICD-10-CM | POA: Diagnosis not present

## 2016-07-13 DIAGNOSIS — Y939 Activity, unspecified: Secondary | ICD-10-CM | POA: Diagnosis not present

## 2016-07-13 MED ORDER — ACETAMINOPHEN 325 MG PO TABS
650.0000 mg | ORAL_TABLET | Freq: Once | ORAL | Status: AC
  Administered 2016-07-13: 650 mg via ORAL
  Filled 2016-07-13: qty 2

## 2016-07-13 NOTE — ED Notes (Signed)
Bed: [facility] Expected date:  Expected time:  Means of arrival:  Comments: 

## 2016-07-13 NOTE — ED Triage Notes (Signed)
Per EMS: [facility] from [facility].  Pt was having a bowel movement and fell off the toilet.  Unwitnessed.  Was A&O x 4 when staff found him.  Now complaining of low back pain.  No deficits.  No deformities.  No thinners.

## 2016-07-13 NOTE — ED Provider Notes (Signed)
[facility]-EMERGENCY DEPT Provider Note   CSN: [id number] Arrival date & time: 07/13/16  [id number]     History   Chief Complaint Chief Complaint  Patient presents with  . Fall    HPI [name] is a 82 y.o. male.  HPI Patient reports he thinks he just "tipped off" the commode this morning. He denies any pain anywhere. EMS or nursing report indicated that at the time he complained of some back pain. Patient is alert and cooperative. No report of other acute complaints. Past Medical History:  Diagnosis Date  . Alzheimer disease   . CKD (chronic kidney disease) stage 3, GFR 30-59 ml/min   . Dementia   . GERD (gastroesophageal reflux disease)   . Hyperlipidemia   . Hypertension   . Kidney stones   . Osteoarthritis   . UTI (lower urinary tract infection)    frequent    Patient Active Problem List   Diagnosis Date Noted  . CAP (community acquired pneumonia) 09/07/2012  . Sepsis(995.91) 09/07/2012  . Acute encephalopathy 09/07/2012  . Thrombocytopenia, unspecified 09/07/2012  . Dementia 09/07/2012  . Unspecified hypothyroidism 09/07/2012  . Essential hypertension, benign 09/07/2012  . Right bundle branch block 09/07/2012    No past surgical history on file.     Home Medications    Prior to Admission medications   Medication Sig Start Date End Date Taking? Authorizing Provider  acetaminophen (TYLENOL) 500 MG tablet Take 500 mg by mouth every 6 (six) hours as needed for mild pain.     Historical Provider, MD  calcium-vitamin D (OSCAL WITH D) 500-200 MG-UNIT tablet Take 1 tablet by mouth daily with breakfast.    Historical Provider, MD  divalproex (DEPAKOTE) 125 MG DR tablet Take 1 tablet by mouth 3 (three) times daily. 06/23/16   Historical Provider, MD  Glucosamine 500 MG CAPS Take 3 capsules by mouth daily.    Historical Provider, MD  hydrochlorothiazide (HYDRODIURIL) 12.5 MG tablet Take 12.5 mg by mouth daily.    Historical Provider, MD  HYDROcodone-acetaminophen  (NORCO/VICODIN) 5-325 MG tablet Take 1-2 tablets by mouth every 6 hours as needed for pain and/or cough. 06/30/16   Nicole Pisciotta, PA-C  levofloxacin (LEVAQUIN) 750 MG tablet Take 1 tablet (750 mg total) by mouth daily. 06/30/16   Nicole Pisciotta, PA-C  levothyroxine (SYNTHROID, LEVOTHROID) 100 MCG tablet Take 100 mcg by mouth daily.    Historical Provider, MD  loperamide (IMODIUM) 2 MG capsule Take 2 mg by mouth as needed for diarrhea or loose stools.    Historical Provider, MD  Melatonin 10 MG TABS Take 1 tablet by mouth at bedtime.    Historical Provider, MD  memantine (NAMENDA) 10 MG tablet Take 10 mg by mouth 2 (two) times daily.    Historical Provider, MD  metoprolol succinate (TOPROL-XL) 25 MG 24 hr tablet Take 25 mg by mouth daily.    Historical Provider, MD  potassium chloride (KLOR-CON) 20 MEQ packet Take 20 mEq by mouth daily.    Historical Provider, MD  sertraline (ZOLOFT) 50 MG tablet Take 50 mg by mouth daily.    Historical Provider, MD  tamsulosin (FLOMAX) 0.4 MG CAPS capsule Take 0.4 mg by mouth daily.    Historical Provider, MD  trimethoprim (TRIMPEX) 100 MG tablet Take 100 mg by mouth daily.    Historical Provider, MD    Family History No family history on file.  Social History Social History  Substance Use Topics  . Smoking status: Former Games developermoker  . Smokeless  tobacco: Never Used  . Alcohol use No     Allergies   Codeine   Review of Systems Review of Systems 10 Systems reviewed and are negative for acute change except as noted in the HPI. Level V caveat dementia  Physical Exam Updated Vital Signs BP 118/77   Pulse 96   Temp 98.4 F (36.9 C)   Resp 16   SpO2 92%   Physical Exam  Constitutional: He appears well-developed and well-nourished.  Patient is alert and nontoxic. No respiratory distress. No expression of pain or discomfort.  HENT:  Head: Normocephalic and atraumatic.  Nose: Nose normal.  Mouth/Throat: Oropharynx is clear and moist.  Eyes:  Conjunctivae and EOM are normal. Pupils are equal, round, and reactive to light.  Neck: Neck supple.  No C-spine tenderness to palpation  Cardiovascular: Normal rate and regular rhythm.   No murmur heard. Pulmonary/Chest: Effort normal and breath sounds normal. No respiratory distress. He exhibits no tenderness.  Abdominal: Soft. He exhibits no distension. There is no tenderness. There is no guarding.  Musculoskeletal: Normal range of motion. He exhibits no edema.  Patient has older ecchymoses lower left flank. This is approximately 15 x 5 cm. Purplish in color appears to be resolving. This area is nontender. Palpation of the spine does not O focal bony point tenderness. When the patient went from completely supine to sitting up he reported pain in his left mid thorax. Pain was not significantly reproducible to palpation. No crepitus. No evident abrasion. Few older abrasions to lower extremities with good healing. The areas of cellulitis or significant edema. Patient can go through range of motion of both lower extremities and push against resistance without pain. Same for upper extremities.  Neurological: He is alert. No cranial nerve deficit. He exhibits normal muscle tone. Coordination normal.  Patient is very alert. He does appear to have dementia. He is however helpful and following commands.  Skin: Skin is warm and dry.  Psychiatric: He has a normal mood and affect.  Nursing note and vitals reviewed.    ED Treatments / Results  Labs (all labs ordered are listed, but only abnormal results are displayed) Labs Reviewed - No data to display  EKG  EKG Interpretation None       Radiology No results found.  Procedures Procedures (including critical care time)  Medications Ordered in ED Medications  acetaminophen (TYLENOL) tablet 650 mg (650 mg Oral Given 07/13/16 1032)     Initial Impression / Assessment and Plan / ED Course  I have reviewed the triage vital signs and the  nursing notes.  Pertinent labs & imaging results that were available during my care of the patient were reviewed by me and considered in my medical decision making (see chart for details).  Clinical Course       Final Clinical Impressions(s) / ED Diagnoses   Final diagnoses:  Fall, initial encounter  Thoracic myofascial strain, initial encounter   Clinically, patient does not show signs of acute significant injury. No new apparent bruises or abrasions from this fall. She does not have pain complaints. Some discomfort with going from completely supine to sitting up expressed in the right thoracic region. Lungs are clear with no respiratory distress. Pain was not significantly reproducible and no crepitus. At this time, I do not feel additional imaging is needed. Patient be returned to nursing home care for ongoing monitoring. New Prescriptions New Prescriptions   No medications on file     Arby BarretteMarcy Eliani Leclere, MD  07/13/16 1037  

## 2016-07-13 NOTE — ED Notes (Signed)
Bed: [facility] Expected date:  Expected time:  Means of arrival:  Comments: 82 yo Fall

## 2016-07-13 NOTE — ED Notes (Signed)
Called [facility] 

## 2016-07-26 ENCOUNTER — Emergency Department (HOSPITAL_BASED_OUTPATIENT_CLINIC_OR_DEPARTMENT_OTHER): Payer: Medicare Other

## 2016-07-26 ENCOUNTER — Encounter (HOSPITAL_BASED_OUTPATIENT_CLINIC_OR_DEPARTMENT_OTHER): Payer: Self-pay | Admitting: *Deleted

## 2016-07-26 ENCOUNTER — Emergency Department (HOSPITAL_BASED_OUTPATIENT_CLINIC_OR_DEPARTMENT_OTHER)
Admission: EM | Admit: 2016-07-26 | Discharge: 2016-07-26 | Disposition: A | Discharge status: Home / Self Care | Payer: Medicare Other | Attending: Emergency Medicine | Admitting: Emergency Medicine

## 2016-07-26 DIAGNOSIS — Y92129 Unspecified place in nursing home as the place of occurrence of the external cause: Secondary | ICD-10-CM | POA: Diagnosis not present

## 2016-07-26 DIAGNOSIS — Z79899 Other long term (current) drug therapy: Secondary | ICD-10-CM | POA: Diagnosis not present

## 2016-07-26 DIAGNOSIS — W1800XA Striking against unspecified object with subsequent fall, initial encounter: Secondary | ICD-10-CM | POA: Diagnosis not present

## 2016-07-26 DIAGNOSIS — N183 Chronic kidney disease, stage 3 (moderate): Secondary | ICD-10-CM | POA: Diagnosis not present

## 2016-07-26 DIAGNOSIS — Y9389 Activity, other specified: Secondary | ICD-10-CM | POA: Insufficient documentation

## 2016-07-26 DIAGNOSIS — W19XXXA Unspecified fall, initial encounter: Secondary | ICD-10-CM

## 2016-07-26 DIAGNOSIS — Z87891 Personal history of nicotine dependence: Secondary | ICD-10-CM | POA: Diagnosis not present

## 2016-07-26 DIAGNOSIS — I129 Hypertensive chronic kidney disease with stage 1 through stage 4 chronic kidney disease, or unspecified chronic kidney disease: Secondary | ICD-10-CM | POA: Diagnosis not present

## 2016-07-26 DIAGNOSIS — F0280 Dementia in other diseases classified elsewhere without behavioral disturbance: Secondary | ICD-10-CM | POA: Insufficient documentation

## 2016-07-26 DIAGNOSIS — S0990XA Unspecified injury of head, initial encounter: Secondary | ICD-10-CM | POA: Diagnosis present

## 2016-07-26 DIAGNOSIS — G308 Other Alzheimer's disease: Secondary | ICD-10-CM | POA: Insufficient documentation

## 2016-07-26 DIAGNOSIS — Y999 Unspecified external cause status: Secondary | ICD-10-CM | POA: Insufficient documentation

## 2016-07-26 DIAGNOSIS — S0003XA Contusion of scalp, initial encounter: Secondary | ICD-10-CM | POA: Insufficient documentation

## 2016-07-26 DIAGNOSIS — E039 Hypothyroidism, unspecified: Secondary | ICD-10-CM | POA: Insufficient documentation

## 2016-07-26 NOTE — ED Notes (Signed)
[facility] contacted for transport back to [facility]

## 2016-07-26 NOTE — ED Notes (Signed)
[facility] contacted for status update. Dispatch states a truck is on the way

## 2016-07-26 NOTE — ED Provider Notes (Addendum)
[facility]   CSN: [id number] Arrival date & time: 07/26/16  1155     History   Chief Complaint Chief Complaint  Patient presents with  . Fall    Level V caveat due to dementia. HPI [name] is a 83 y.o. male.  HPI  Patient had a fall at the nursing home. Patient's daughter is here as finding the history. Reportedly got out of his wheelchair grabbed someone else's walker and fell down striking the back of his head. No loss conscious. Has been generally weak for last couple weeks has had frequent falls. Patient is basically at his baseline right now. Patient is without complaints. Past Medical History:  Diagnosis Date  . Alzheimer disease   . CKD (chronic kidney disease) stage 3, GFR 30-59 ml/min   . Dementia   . GERD (gastroesophageal reflux disease)   . Hyperlipidemia   . Hypertension   . Kidney stones   . Osteoarthritis   . UTI (lower urinary tract infection)    frequent    Patient Active Problem List   Diagnosis Date Noted  . CAP (community acquired pneumonia) 09/07/2012  . Sepsis(995.91) 09/07/2012  . Acute encephalopathy 09/07/2012  . Thrombocytopenia, unspecified 09/07/2012  . Dementia 09/07/2012  . Unspecified hypothyroidism 09/07/2012  . Essential hypertension, benign 09/07/2012  . Right bundle branch block 09/07/2012    No past surgical history on file.     Home Medications    Prior to Admission medications   Medication Sig Start Date End Date Taking? Authorizing Provider  acetaminophen (TYLENOL) 500 MG tablet Take 500 mg by mouth 3 (three) times daily.    Yes Historical Provider, MD  calcium-vitamin D (OSCAL WITH D) 500-200 MG-UNIT tablet Take 1 tablet by mouth daily with breakfast.   Yes Historical Provider, MD  Glucosamine 500 MG CAPS Take 3 capsules by mouth daily.   Yes Historical Provider, MD  levothyroxine (SYNTHROID, LEVOTHROID) 100 MCG tablet Take 100 mcg by mouth daily.   Yes Historical Provider, MD    loperamide (IMODIUM) 2 MG capsule Take 2 mg by mouth as needed for diarrhea or loose stools.   Yes Historical Provider, MD  Melatonin 10 MG TABS Take 1 tablet by mouth at bedtime.   Yes Historical Provider, MD  memantine (NAMENDA) 10 MG tablet Take 10 mg by mouth 2 (two) times daily.   Yes Historical Provider, MD  metoprolol succinate (TOPROL-XL) 25 MG 24 hr tablet Take 25 mg by mouth daily.   Yes Historical Provider, MD  potassium chloride (KLOR-CON) 20 MEQ packet Take 20 mEq by mouth daily.   Yes Historical Provider, MD  sertraline (ZOLOFT) 50 MG tablet Take 50 mg by mouth daily.   Yes Historical Provider, MD  tamsulosin (FLOMAX) 0.4 MG CAPS capsule Take 0.4 mg by mouth daily.   Yes Historical Provider, MD  divalproex (DEPAKOTE) 125 MG DR tablet Take 1 tablet by mouth 3 (three) times daily. 06/23/16   Historical Provider, MD  hydrochlorothiazide (HYDRODIURIL) 12.5 MG tablet Take 12.5 mg by mouth daily.    Historical Provider, MD  HYDROcodone-acetaminophen (NORCO/VICODIN) 5-325 MG tablet Take 1-2 tablets by mouth every 6 hours as needed for pain and/or cough. 06/30/16   Nicole Pisciotta, PA-C  levofloxacin (LEVAQUIN) 750 MG tablet Take 1 tablet (750 mg total) by mouth daily. 06/30/16   Nicole Pisciotta, PA-C  trimethoprim (TRIMPEX) 100 MG tablet Take 100 mg by mouth daily.    Historical Provider, MD    Family History  No family history on file.  Social History Social History  Substance Use Topics  . Smoking status: Former Games developermoker  . Smokeless tobacco: Never Used  . Alcohol use No     Allergies   Codeine   Review of Systems Review of Systems  Unable to perform ROS: Dementia     Physical Exam Updated Vital Signs BP 147/93 (BP Location: Right Arm)   Pulse 73   Temp 97.8 F (36.6 C) (Oral)   Resp 16   SpO2 97%   Physical Exam  Constitutional: He appears well-developed.  HENT:  Head: Normocephalic.  Hematoma with small skin tear in right occipital area. Approximately 3 cm  in diameter.  Eyes: Pupils are equal, round, and reactive to light.  Neck: Neck supple.  Cardiovascular: Normal rate.   Pulmonary/Chest: Effort normal.  Abdominal: Soft. There is no tenderness.  Musculoskeletal: He exhibits no edema or tenderness.  No cervicothoracic or lumbar tenderness. No tenderness over hips or extremities.  Neurological: He is alert.   awake and pleasant, but at his baseline dementia per family member.  Skin: Skin is warm.     ED Treatments / Results  Labs (all labs ordered are listed, but only abnormal results are displayed) Labs Reviewed - No data to display  EKG  EKG Interpretation None       Radiology Ct Head Wo Contrast  Result Date: 07/26/2016 CLINICAL DATA:  Larey SeatFell earlier today with trauma to the back of the head. EXAM: CT HEAD WITHOUT CONTRAST TECHNIQUE: Contiguous axial images were obtained from the base of the skull through the vertex without intravenous contrast. COMPARISON:  07/08/2016 FINDINGS: Brain: No change. Generalized atrophy with extensive chronic small-vessel ischemic changes throughout. 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 No mass lesion, hemorrhage, hydrocephalus or extra-axial collection. Ventricular prominence is felt secondary to ex vacuo enlargement. Vascular: There is atherosclerotic calcification of the major vessels at the base of the brain. Skull: No skull fracture. Sinuses/Orbits: Mild mucosal inflammatory changes in the ethmoid sinuses. Orbits negative. Other: Right posterior parietal scalp swelling. IMPRESSION: Right posterior parietal scalp swelling. No underlying skull fracture. No acute intracranial finding. Atrophy an extensive chronic small vessel ischemic changes. Electronically Signed   By: Paulina FusiMark  Shogry M.D.   On: 07/26/2016 12:49    Procedures Procedures (including critical care time)  Medications Ordered in ED Medications - No data to display   Initial Impression / Assessment and Plan / ED Course  I have  reviewed the triage vital signs and the nursing notes.  Pertinent labs & imaging results that were available during my care of the patient were reviewed by me and considered in my medical decision making (see chart for details).  Clinical Course      Patient with fall. Negative head CT. Has hematoma. At baseline. No cervical spine or other tenderness. Discharge back to nursing home. Discussed with patient's daughter. Patient is reportedly on antibiotics for pneumonia. On reexamination has had some wheezing on the lungs. Not hypoxic. Still will discharge to follow-up with his doctor.   Final Clinical Impressions(s) / ED Diagnoses   Final diagnoses:  Fall, initial encounter  Hematoma of scalp, initial encounter    New Prescriptions New Prescriptions   No medications on file     Benjiman CoreNathan Dimitri Shakespeare, MD 07/26/16 1255    Benjiman CoreNathan Tyrice Hewitt, MD 07/26/16 1418

## 2016-07-26 NOTE — ED Notes (Signed)
[facility].

## 2016-07-26 NOTE — ED Triage Notes (Signed)
States pt is in w/c now and got someone else's walker and took a few steps and fell straight onto carpet floor. No LOC.  Onset 1 hour ago. No other injury.

## 2016-07-26 NOTE — ED Notes (Addendum)
[facility] called to transport pt back to [facility]. Wound cleaned and bacitracin applied with 4x4 and kerlex drsg to secure. Tolerated well.

## 2016-07-26 NOTE — ED Notes (Signed)
[facility] here to pick up pt. Report given to EMT with [facility].

## 2016-08-07 ENCOUNTER — Emergency Department (HOSPITAL_COMMUNITY)
Admission: EM | Admit: 2016-08-07 | Discharge: 2016-08-08 | Disposition: A | Discharge status: Home / Self Care | Payer: Medicare Other | Attending: Emergency Medicine | Admitting: Emergency Medicine

## 2016-08-07 ENCOUNTER — Encounter (HOSPITAL_COMMUNITY): Payer: Self-pay | Admitting: Emergency Medicine

## 2016-08-07 ENCOUNTER — Emergency Department (HOSPITAL_COMMUNITY): Payer: Medicare Other

## 2016-08-07 DIAGNOSIS — S0181XA Laceration without foreign body of other part of head, initial encounter: Secondary | ICD-10-CM

## 2016-08-07 DIAGNOSIS — W07XXXA Fall from chair, initial encounter: Secondary | ICD-10-CM | POA: Diagnosis not present

## 2016-08-07 DIAGNOSIS — S0990XA Unspecified injury of head, initial encounter: Secondary | ICD-10-CM | POA: Diagnosis present

## 2016-08-07 DIAGNOSIS — G309 Alzheimer's disease, unspecified: Secondary | ICD-10-CM | POA: Insufficient documentation

## 2016-08-07 DIAGNOSIS — E039 Hypothyroidism, unspecified: Secondary | ICD-10-CM | POA: Insufficient documentation

## 2016-08-07 DIAGNOSIS — Y929 Unspecified place or not applicable: Secondary | ICD-10-CM | POA: Insufficient documentation

## 2016-08-07 DIAGNOSIS — I129 Hypertensive chronic kidney disease with stage 1 through stage 4 chronic kidney disease, or unspecified chronic kidney disease: Secondary | ICD-10-CM | POA: Diagnosis not present

## 2016-08-07 DIAGNOSIS — Y999 Unspecified external cause status: Secondary | ICD-10-CM | POA: Insufficient documentation

## 2016-08-07 DIAGNOSIS — Y939 Activity, unspecified: Secondary | ICD-10-CM | POA: Insufficient documentation

## 2016-08-07 DIAGNOSIS — Z87891 Personal history of nicotine dependence: Secondary | ICD-10-CM | POA: Diagnosis not present

## 2016-08-07 DIAGNOSIS — S01111A Laceration without foreign body of right eyelid and periocular area, initial encounter: Secondary | ICD-10-CM | POA: Diagnosis not present

## 2016-08-07 DIAGNOSIS — N183 Chronic kidney disease, stage 3 (moderate): Secondary | ICD-10-CM | POA: Insufficient documentation

## 2016-08-07 MED ORDER — LIDOCAINE-EPINEPHRINE (PF) 2 %-1:200000 IJ SOLN
INTRAMUSCULAR | Status: AC
  Filled 2016-08-07: qty 20

## 2016-08-07 MED ORDER — LIDOCAINE-EPINEPHRINE (PF) 2 %-1:200000 IJ SOLN
20.0000 mL | Freq: Once | INTRAMUSCULAR | Status: AC
  Administered 2016-08-08: 20 mL

## 2016-08-07 NOTE — ED Triage Notes (Signed)
Pt brought in by EMS from [facility] after having a witnessed fall tonight  Pt stood up from a chair and fell face first onto the carpeted floor  No LOC  Pt has abrasions noted to the right side of his face  Pt is not on any blood thinners   Pt has dementia/alzheimers  Pt behavior is at baseline per nursing home staff

## 2016-08-07 NOTE — ED Provider Notes (Signed)
[facility]-EMERGENCY DEPT Provider Note   CSN: [id number] Arrival date & time: 08/07/16  2235     History   Chief Complaint Chief Complaint  Patient presents with  . Fall    HPI [name] is a 83 y.o. male.  HPI 83 year old male who presents after unwitnessed fall. Level V caveat due to dementia. History of Alzheimer's dementia, CKD, HTN, HLD. From skilled nursing facility. Spoke with nurse at facility, who states that patient was given nighttime seroquel for sleep. They stepped out of the room, and then later on found him on the ground. It seemed as if he was trying to get out from recliner and fell. He has been sleepy, but this is normal for him after getting seroquel. He denies any complaints including pain or headache. Tetanus UTD per family. Per patient's nurse, he has not had any recent illness. He has otherwise been in his usual state of health.  Past Medical History:  Diagnosis Date  . Alzheimer disease   . CKD (chronic kidney disease) stage 3, GFR 30-59 ml/min   . Dementia   . GERD (gastroesophageal reflux disease)   . Hyperlipidemia   . Hypertension   . Kidney stones   . Osteoarthritis   . UTI (lower urinary tract infection)    frequent    Patient Active Problem List   Diagnosis Date Noted  . CAP (community acquired pneumonia) 09/07/2012  . Sepsis(995.91) 09/07/2012  . Acute encephalopathy 09/07/2012  . Thrombocytopenia, unspecified 09/07/2012  . Dementia 09/07/2012  . Unspecified hypothyroidism 09/07/2012  . Essential hypertension, benign 09/07/2012  . Right bundle branch block 09/07/2012    History reviewed. No pertinent surgical history.     Home Medications    Prior to Admission medications   Medication Sig Start Date End Date Taking? Authorizing Provider  acetaminophen (TYLENOL) 500 MG tablet Take 500 mg by mouth 3 (three) times daily.     Historical Provider, MD  calcium-vitamin D (OSCAL WITH D) 500-200 MG-UNIT tablet Take 1 tablet by mouth daily  with breakfast.    Historical Provider, MD  divalproex (DEPAKOTE) 125 MG DR tablet Take 1 tablet by mouth 3 (three) times daily. 06/23/16   Historical Provider, MD  Glucosamine 500 MG CAPS Take 3 capsules by mouth daily.    Historical Provider, MD  hydrochlorothiazide (HYDRODIURIL) 12.5 MG tablet Take 12.5 mg by mouth daily.    Historical Provider, MD  HYDROcodone-acetaminophen (NORCO/VICODIN) 5-325 MG tablet Take 1-2 tablets by mouth every 6 hours as needed for pain and/or cough. 06/30/16   Nicole Pisciotta, PA-C  levofloxacin (LEVAQUIN) 750 MG tablet Take 1 tablet (750 mg total) by mouth daily. 06/30/16   Nicole Pisciotta, PA-C  levothyroxine (SYNTHROID, LEVOTHROID) 100 MCG tablet Take 100 mcg by mouth daily.    Historical Provider, MD  loperamide (IMODIUM) 2 MG capsule Take 2 mg by mouth as needed for diarrhea or loose stools.    Historical Provider, MD  Melatonin 10 MG TABS Take 1 tablet by mouth at bedtime.    Historical Provider, MD  memantine (NAMENDA) 10 MG tablet Take 10 mg by mouth 2 (two) times daily.    Historical Provider, MD  metoprolol succinate (TOPROL-XL) 25 MG 24 hr tablet Take 25 mg by mouth daily.    Historical Provider, MD  potassium chloride (KLOR-CON) 20 MEQ packet Take 20 mEq by mouth daily.    Historical Provider, MD  sertraline (ZOLOFT) 50 MG tablet Take 50 mg by mouth daily.  Historical Provider, MD  tamsulosin (FLOMAX) 0.4 MG CAPS capsule Take 0.4 mg by mouth daily.    Historical Provider, MD  trimethoprim (TRIMPEX) 100 MG tablet Take 100 mg by mouth daily.    Historical Provider, MD    Family History History reviewed. No pertinent family history.  Social History Social History  Substance Use Topics  . Smoking status: Former Games developer  . Smokeless tobacco: Never Used  . Alcohol use No     Allergies   Codeine   Review of Systems Review of Systems Unable to obtain due to dementia  Physical Exam Updated Vital Signs BP 119/69 (BP Location: Left Arm)    Pulse 68   Resp 16   SpO2 95%   Physical Exam Physical Exam  Nursing note and vitals reviewed. Constitutional: non-toxic, and in no acute distress Head: Normocephalic. 3 cm laceration above the right eyebrow Mouth/Throat: Oropharynx is clear and moist.  Neck: cervical collar in place. .  Cardiovascular: Normal rate and regular rhythm.  no chest wall tenderness Pulmonary/Chest: Effort normal and breath sounds normal.  Abdominal: Soft. There is no tenderness. There is no rebound and no guarding.  Musculoskeletal: normal range of motion of all 4 extremities. No deformities. No hip tenderness. Neurological: Alert, no facial droop, fluent speech, moves all extremities symmetrically Skin: Skin is warm and dry.  Psychiatric: Cooperative   ED Treatments / Results  Labs (all labs ordered are listed, but only abnormal results are displayed) Labs Reviewed - No data to display  EKG  EKG Interpretation None       Radiology Ct Head Wo Contrast  Result Date: 08/08/2016 CLINICAL DATA:  83 y/o M; status post fall with laceration to right eyebrow. EXAM: CT HEAD WITHOUT CONTRAST CT CERVICAL SPINE WITHOUT CONTRAST TECHNIQUE: Multidetector CT imaging of the head and cervical spine was performed following the standard protocol without intravenous contrast. Multiplanar CT image reconstructions of the cervical spine were also generated. COMPARISON:  07/26/2016 CT head. 07/08/2016 CT head and cervical spine. FINDINGS: CT HEAD FINDINGS Brain: No evidence of acute infarction, hemorrhage, hydrocephalus, extra-axial collection or mass lesion/mass effect. Stable chronic right occipital lobe infarct, advanced chronic microvascular ischemic changes of the brain and parenchymal volume loss. Vascular: Mild calcific atherosclerosis of cavernous and paraclinoid internal carotid arteries. Skull: Right parietal scalp soft tissue thickening may represent a small contusion. No displaced calvarial fracture.  Sinuses/Orbits: Maxillary and sphenoid sinus mucosal thickening with bilateral maxillary sinus fluid levels increased from prior study. Bilateral intra-ocular lens replacement. Normally pneumatized mastoid air cells. Other: None. CT CERVICAL SPINE FINDINGS Alignment: Normal. Skull base and vertebrae: No acute fracture. No primary bone lesion or focal pathologic process. 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 Soft tissues and spinal canal: No prevertebral fluid or swelling. No visible canal hematoma. Disc levels: Cervical spondylosis greatest at the C3-4 level where there is a calcified disc bulge resulting in mild-to-moderate stenosis. No high-grade bony foraminal narrowing. Upper chest: Mild peripheral fibrotic changes. Other: Calcific atherosclerosis of bilateral carotid bifurcations. IMPRESSION: 1. No acute intracranial abnormality. 2. Right parietal scalp soft tissue thickening may represent a small contusion. No displaced calvarial fracture. 3. Paranasal sinus disease with new fluid levels which may represent acute sinusitis in the appropriate clinical setting. 4. Stable advanced chronic microvascular ischemic changes and parenchymal volume loss of the brain as well as chronic right occipital infarct. 5. No acute fracture or dislocation of the cervical spine. Electronically Signed   By: Micah Noel  [place]-Stratton M.D.   On: 08/08/2016 00:04   Ct Cervical Spine Wo Contrast  Result Date: 08/08/2016 CLINICAL DATA:  83 y/o M; status post fall with laceration to right eyebrow. EXAM: CT HEAD WITHOUT CONTRAST CT CERVICAL SPINE WITHOUT CONTRAST TECHNIQUE: Multidetector CT imaging of the head and cervical spine was performed following the standard protocol without intravenous contrast. Multiplanar CT image reconstructions of the cervical spine were also generated. COMPARISON:  07/26/2016 CT head. 07/08/2016 CT head and cervical spine. FINDINGS: CT HEAD FINDINGS  Brain: No evidence of acute infarction, hemorrhage, hydrocephalus, extra-axial collection or mass lesion/mass effect. Stable chronic right occipital lobe infarct, advanced chronic microvascular ischemic changes of the brain and parenchymal volume loss. Vascular: Mild calcific atherosclerosis of cavernous and paraclinoid internal carotid arteries. Skull: Right parietal scalp soft tissue thickening may represent a small contusion. No displaced calvarial fracture. Sinuses/Orbits: Maxillary and sphenoid sinus mucosal thickening with bilateral maxillary sinus fluid levels increased from prior study. Bilateral intra-ocular lens replacement. Normally pneumatized mastoid air cells. Other: None. CT CERVICAL SPINE FINDINGS Alignment: Normal. Skull base and vertebrae: No acute fracture. No primary bone lesion or focal pathologic process. 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 Soft tissues and spinal canal: No prevertebral fluid or swelling. No visible canal hematoma. Disc levels: Cervical spondylosis greatest at the C3-4 level where there is a calcified disc bulge resulting in mild-to-moderate stenosis. No high-grade bony foraminal narrowing. Upper chest: Mild peripheral fibrotic changes. Other: Calcific atherosclerosis of bilateral carotid bifurcations. IMPRESSION: 1. No acute intracranial abnormality. 2. Right parietal scalp soft tissue thickening may represent a small contusion. No displaced calvarial fracture. 3. Paranasal sinus disease with new fluid levels which may represent acute sinusitis in the appropriate clinical setting. 4. Stable advanced chronic microvascular ischemic changes and parenchymal volume loss of the brain as well as chronic right occipital infarct. 5. No acute fracture or dislocation of the cervical spine. Electronically Signed   By: Mitzi Hansen M.D.   On: 08/08/2016 00:04    Procedures .Marland KitchenLaceration Repair Date/Time: 08/08/2016  12:03 AM Performed by: Crista Curb DUO Authorized by: Crista Curb DUO   Consent:    Consent obtained:  Verbal   Consent given by:  Healthcare agent   Risks discussed:  Infection, pain, poor cosmetic result and need for additional repair   Alternatives discussed:  No treatment Anesthesia (see MAR for exact dosages):    Anesthesia method:  None Laceration details:    Location:  Face   Face location:  R eyebrow   Length (cm):  3   Depth (mm):  4 Repair type:    Repair type:  Complex Pre-procedure details:    Preparation:  Patient was prepped and draped in usual sterile fashion Exploration:    Limited defect created (wound extended): no     Hemostasis achieved with:  Direct pressure   Wound exploration: entire depth of wound probed and visualized     Contaminated: no   Treatment:    Area cleansed with:  Betadine   Amount of cleaning:  Standard   Irrigation solution:  Tap water   Irrigation volume:  500   Irrigation method:  Syringe   Visualized foreign bodies/material removed: yes     Debridement:  Moderate   Undermining:  None   Scar revision: no   Skin repair:    Repair method:  Sutures   Suture size:  6-0   Suture material:  Nylon   Suture technique:  Simple interrupted  Number of sutures:  4 Approximation:    Approximation:  Close   Vermilion border: well-aligned   Post-procedure details:    Dressing:  Non-adherent dressing   Patient tolerance of procedure:  Tolerated well, no immediate complications   (including critical care time)  Medications Ordered in ED Medications  lidocaine-EPINEPHrine (XYLOCAINE W/EPI) 2 %-1:200000 (PF) injection 20 mL (not administered)  lidocaine-EPINEPHrine (XYLOCAINE W/EPI) 2 %-1:200000 (PF) injection (not administered)     Initial Impression / Assessment and Plan / ED Course  I have reviewed the triage vital signs and the nursing notes.  Pertinent labs & imaging results that were available during my care of the patient were  reviewed by me and considered in my medical decision making (see chart for details).     Records reviewed, multiple ED visits for frequent falls. Sleepy on arrival, but arouses to voice and tactile stimulation and answers questions appropriate and baseline per patient's son.   With head laceration, repaired at bedside.  Ct head and cervical spine visualized. No other injuries identified on exam. If negative CT head and neck, will be discharged back to facility.   Final Clinical Impressions(s) / ED Diagnoses   Final diagnoses:  Injury of head, initial encounter  Facial laceration, initial encounter    New Prescriptions New Prescriptions   No medications on file     Lavera Guiseana Duo Yolanda Dockendorf, MD 08/08/16 [id number]

## 2016-08-08 NOTE — Discharge Instructions (Signed)
Please have sutures removed in 5 days.   Watch for signs of infection. Return for worsening symptoms, including fever, confusion, increased redness/swelling or pus drainage from wound, or any other symptoms concerning to you.

## 2016-08-08 NOTE — ED Notes (Signed)
[facility] called for transport.  

## 2016-08-09 ENCOUNTER — Encounter (HOSPITAL_BASED_OUTPATIENT_CLINIC_OR_DEPARTMENT_OTHER): Payer: Self-pay | Admitting: Emergency Medicine

## 2016-08-09 ENCOUNTER — Emergency Department (HOSPITAL_BASED_OUTPATIENT_CLINIC_OR_DEPARTMENT_OTHER)
Admission: EM | Admit: 2016-08-09 | Discharge: 2016-08-09 | Disposition: A | Discharge status: Skilled Nursing Facility | Payer: Medicare Other | Attending: Emergency Medicine | Admitting: Emergency Medicine

## 2016-08-09 DIAGNOSIS — Z79899 Other long term (current) drug therapy: Secondary | ICD-10-CM | POA: Diagnosis not present

## 2016-08-09 DIAGNOSIS — Y92129 Unspecified place in nursing home as the place of occurrence of the external cause: Secondary | ICD-10-CM | POA: Insufficient documentation

## 2016-08-09 DIAGNOSIS — Y939 Activity, unspecified: Secondary | ICD-10-CM | POA: Insufficient documentation

## 2016-08-09 DIAGNOSIS — Y999 Unspecified external cause status: Secondary | ICD-10-CM | POA: Insufficient documentation

## 2016-08-09 DIAGNOSIS — I129 Hypertensive chronic kidney disease with stage 1 through stage 4 chronic kidney disease, or unspecified chronic kidney disease: Secondary | ICD-10-CM | POA: Insufficient documentation

## 2016-08-09 DIAGNOSIS — Z87891 Personal history of nicotine dependence: Secondary | ICD-10-CM | POA: Diagnosis not present

## 2016-08-09 DIAGNOSIS — S0181XA Laceration without foreign body of other part of head, initial encounter: Secondary | ICD-10-CM | POA: Diagnosis present

## 2016-08-09 DIAGNOSIS — N183 Chronic kidney disease, stage 3 (moderate): Secondary | ICD-10-CM | POA: Insufficient documentation

## 2016-08-09 DIAGNOSIS — W19XXXA Unspecified fall, initial encounter: Secondary | ICD-10-CM | POA: Insufficient documentation

## 2016-08-09 DIAGNOSIS — F0390 Unspecified dementia without behavioral disturbance: Secondary | ICD-10-CM

## 2016-08-09 NOTE — ED Triage Notes (Signed)
Pt with unwitnessed fall. Laceration to the left eye. Hx of frequent falls. EKG wdl. A/O nad at triage.

## 2016-08-09 NOTE — ED Notes (Signed)
[facility] AT BEDSIDE. 

## 2016-08-09 NOTE — ED Provider Notes (Signed)
[facility]   CSN: [id number] Arrival date & time: 08/09/16  4098     History   Chief Complaint Chief Complaint  Patient presents with  . Fall   Level V caveat due to dementia HPI [name] is a 83 y.o. male.  HPI Patient presents with recurrent fall. Reportedly had another fall today. Laceration above his left eye. He has had 5 visits to the ER in the last month. He is at the nursing home and has a new gerontologist that his been helping with the falls. Patient is without complaints. He presents with his daughter. Laceration above the left eye. Past Medical History:  Diagnosis Date  . Alzheimer disease   . CKD (chronic kidney disease) stage 3, GFR 30-59 ml/min   . Dementia   . GERD (gastroesophageal reflux disease)   . Hyperlipidemia   . Hypertension   . Kidney stones   . Osteoarthritis   . UTI (lower urinary tract infection)    frequent    Patient Active Problem List   Diagnosis Date Noted  . CAP (community acquired pneumonia) 09/07/2012  . Sepsis(995.91) 09/07/2012  . Acute encephalopathy 09/07/2012  . Thrombocytopenia, unspecified 09/07/2012  . Dementia 09/07/2012  . Unspecified hypothyroidism 09/07/2012  . Essential hypertension, benign 09/07/2012  . Right bundle branch block 09/07/2012    History reviewed. No pertinent surgical history.     Home Medications    Prior to Admission medications   Medication Sig Start Date End Date Taking? Authorizing Provider  acetaminophen (TYLENOL) 500 MG tablet Take 500 mg by mouth 3 (three) times daily.    Yes Historical Provider, MD  ALPRAZolam (XANAX) 0.25 MG tablet Take 0.25 mg by mouth every 4 (four) hours.   Yes Historical Provider, MD  calcium-vitamin D (OSCAL WITH D) 500-200 MG-UNIT tablet Take 1 tablet by mouth daily with breakfast.   Yes Historical Provider, MD  Glucosamine 500 MG CAPS Take 3 capsules by mouth daily.   Yes Historical Provider, MD  hydrochlorothiazide (HYDRODIURIL)  12.5 MG tablet Take 12.5 mg by mouth daily.   Yes Historical Provider, MD  levothyroxine (SYNTHROID, LEVOTHROID) 100 MCG tablet Take 100 mcg by mouth daily.   Yes Historical Provider, MD  loperamide (IMODIUM) 2 MG capsule Take 2 mg by mouth as needed for diarrhea or loose stools.   Yes Historical Provider, MD  Melatonin 10 MG TABS Take 1 tablet by mouth at bedtime.   Yes Historical Provider, MD  memantine (NAMENDA) 10 MG tablet Take 10 mg by mouth 2 (two) times daily.   Yes Historical Provider, MD  metoprolol succinate (TOPROL-XL) 25 MG 24 hr tablet Take 25 mg by mouth daily.   Yes Historical Provider, MD  potassium chloride (KLOR-CON) 20 MEQ packet Take 20 mEq by mouth daily.   Yes Historical Provider, MD  QUEtiapine (SEROQUEL) 25 MG tablet Take 25 mg by mouth at bedtime.   Yes Historical Provider, MD  tamsulosin (FLOMAX) 0.4 MG CAPS capsule Take 0.4 mg by mouth daily.   Yes Historical Provider, MD  divalproex (DEPAKOTE) 125 MG DR tablet Take 1 tablet by mouth 3 (three) times daily. 06/23/16   Historical Provider, MD  HYDROcodone-acetaminophen (NORCO/VICODIN) 5-325 MG tablet Take 1-2 tablets by mouth every 6 hours as needed for pain and/or cough. 06/30/16   Nicole Pisciotta, PA-C  levofloxacin (LEVAQUIN) 750 MG tablet Take 1 tablet (750 mg total) by mouth daily. 06/30/16   Nicole Pisciotta, PA-C  sertraline (ZOLOFT) 50 MG tablet Take  50 mg by mouth daily.    Historical Provider, MD  trimethoprim (TRIMPEX) 100 MG tablet Take 100 mg by mouth daily.    Historical Provider, MD    Family History No family history on file.  Social History Social History  Substance Use Topics  . Smoking status: Former Games developermoker  . Smokeless tobacco: Never Used  . Alcohol use No     Allergies   Codeine   Review of Systems Review of Systems  Unable to perform ROS: Dementia     Physical Exam Updated Vital Signs BP 149/77 (BP Location: Right Arm)   Pulse 69   Temp 98.6 F (37 C) (Oral)   Resp 20   SpO2  95% Comment: Simultaneous filing. User may not have seen previous data.  Physical Exam  Constitutional: He appears well-developed.  HENT:  Healing and sutured wound above right eye. Some ecchymosis on the right periorbital and temporal area. Proximally 1 cm laceration horizontally above left eyebrow. Well approximated. Eye movements intact. No other tenderness on his head.  Eyes: EOM are normal.  Neck: Neck supple.  Painless range of motion  Cardiovascular: Normal rate.   Pulmonary/Chest: No respiratory distress. He has no wheezes.  Abdominal: There is no tenderness.  Musculoskeletal: He exhibits no tenderness.  Neurological: He is alert.  Patient is sitting in bed with his eyes closed. He will answer to voice and discusses with me and family member. Moves all extremities. Without complaints.  Skin: Skin is warm.     ED Treatments / Results  Labs (all labs ordered are listed, but only abnormal results are displayed) Labs Reviewed - No data to display  EKG  EKG Interpretation None       Radiology Ct Head Wo Contrast  Result Date: 08/08/2016 CLINICAL DATA:  83 y/o M; status post fall with laceration to right eyebrow. EXAM: CT HEAD WITHOUT CONTRAST CT CERVICAL SPINE WITHOUT CONTRAST TECHNIQUE: Multidetector CT imaging of the head and cervical spine was performed following the standard protocol without intravenous contrast. Multiplanar CT image reconstructions of the cervical spine were also generated. COMPARISON:  07/26/2016 CT head. 07/08/2016 CT head and cervical spine. FINDINGS: CT HEAD FINDINGS Brain: No evidence of acute infarction, hemorrhage, hydrocephalus, extra-axial collection or mass lesion/mass effect. Stable chronic right occipital lobe infarct, advanced chronic microvascular ischemic changes of the brain and parenchymal volume loss. Vascular: Mild calcific atherosclerosis of cavernous and paraclinoid internal carotid arteries. Skull: Right parietal scalp soft tissue  thickening may represent a small contusion. No displaced calvarial fracture. Sinuses/Orbits: Maxillary and sphenoid sinus mucosal thickening with bilateral maxillary sinus fluid levels increased from prior study. Bilateral intra-ocular lens replacement. Normally pneumatized mastoid air cells. Other: None. CT CERVICAL SPINE FINDINGS Alignment: Normal. Skull base and vertebrae: No acute fracture. No primary bone lesion or focal pathologic process. 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 Soft tissues and spinal canal: No prevertebral fluid or swelling. No visible canal hematoma. Disc levels: Cervical spondylosis greatest at the C3-4 level where there is a calcified disc bulge resulting in mild-to-moderate stenosis. No high-grade bony foraminal narrowing. Upper chest: Mild peripheral fibrotic changes. Other: Calcific atherosclerosis of bilateral carotid bifurcations. IMPRESSION: 1. No acute intracranial abnormality. 2. Right parietal scalp soft tissue thickening may represent a small contusion. No displaced calvarial fracture. 3. Paranasal sinus disease with new fluid levels which may represent acute sinusitis in the appropriate clinical setting. 4. Stable advanced chronic microvascular ischemic changes and parenchymal volume loss of  the brain as well as chronic right occipital infarct. 5. No acute fracture or dislocation of the cervical spine. Electronically Signed   By: Mitzi Hansen M.D.   On: 08/08/2016 00:04   Ct Cervical Spine Wo Contrast  Result Date: 08/08/2016 CLINICAL DATA:  83 y/o M; status post fall with laceration to right eyebrow. EXAM: CT HEAD WITHOUT CONTRAST CT CERVICAL SPINE WITHOUT CONTRAST TECHNIQUE: Multidetector CT imaging of the head and cervical spine was performed following the standard protocol without intravenous contrast. Multiplanar CT image reconstructions of the cervical spine were also generated. COMPARISON:  07/26/2016  CT head. 07/08/2016 CT head and cervical spine. FINDINGS: CT HEAD FINDINGS Brain: No evidence of acute infarction, hemorrhage, hydrocephalus, extra-axial collection or mass lesion/mass effect. Stable chronic right occipital lobe infarct, advanced chronic microvascular ischemic changes of the brain and parenchymal volume loss. Vascular: Mild calcific atherosclerosis of cavernous and paraclinoid internal carotid arteries. Skull: Right parietal scalp soft tissue thickening may represent a small contusion. No displaced calvarial fracture. Sinuses/Orbits: Maxillary and sphenoid sinus mucosal thickening with bilateral maxillary sinus fluid levels increased from prior study. Bilateral intra-ocular lens replacement. Normally pneumatized mastoid air cells. Other: None. CT CERVICAL SPINE FINDINGS Alignment: Normal. Skull base and vertebrae: No acute fracture. No primary bone lesion or focal pathologic process. 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 Soft tissues and spinal canal: No prevertebral fluid or swelling. No visible canal hematoma. Disc levels: Cervical spondylosis greatest at the C3-4 level where there is a calcified disc bulge resulting in mild-to-moderate stenosis. No high-grade bony foraminal narrowing. Upper chest: Mild peripheral fibrotic changes. Other: Calcific atherosclerosis of bilateral carotid bifurcations. IMPRESSION: 1. No acute intracranial abnormality. 2. Right parietal scalp soft tissue thickening may represent a small contusion. No displaced calvarial fracture. 3. Paranasal sinus disease with new fluid levels which may represent acute sinusitis in the appropriate clinical setting. 4. Stable advanced chronic microvascular ischemic changes and parenchymal volume loss of the brain as well as chronic right occipital infarct. 5. No acute fracture or dislocation of the cervical spine. Electronically Signed   By: Mitzi Hansen M.D.   On:  08/08/2016 00:04    Procedures Procedures (including critical care time)  Medications Ordered in ED Medications - No data to display   Initial Impression / Assessment and Plan / ED Course  I have reviewed the triage vital signs and the nursing notes.  Pertinent labs & imaging results that were available during my care of the patient were reviewed by me and considered in my medical decision making (see chart for details).     Patient with fall and facial laceration. Dementia. Patient is at his baseline. Had discussion with the patient's daughter, who I saw with the patient on a previous fall. Will not get head CT at this time. Aware of risks of bleed that is missed. He is on every 15 minute checks at the nursing home. This should be closely monitoring to watch for mental status changes. Wound closed with Dermabond. Will discharge home. Lungs are clear. He is on treatment for pneumonia.  LACERATION REPAIR Performed by: Billee Cashing Authorized by: Billee Cashing Consent: Verbal consent obtained. Risks and benefits: risks, benefits and alternatives were discussed Consent given by: patient Patient identity confirmed: provided demographic data Prepped and Draped in normal sterile fashion Wound explored  Laceration Location: face  Laceration Length: 1cm  No Foreign Bodies seen or palpated  Skin closure: dermabond  Technique:glued after approximation of wound  Patient tolerance: Patient tolerated the procedure well with no immediate complications.  Final Clinical Impressions(s) / ED Diagnoses   Final diagnoses:  Fall, initial encounter  Facial laceration, initial encounter  Dementia without behavioral disturbance, unspecified dementia type    New Prescriptions New Prescriptions   No medications on file     Benjiman Core, MD 08/09/16 [phone]

## 2016-08-09 NOTE — ED Notes (Signed)
ED Provider at bedside.  Family member present for assessment.

## 2016-10-23 ENCOUNTER — Emergency Department (HOSPITAL_COMMUNITY)
Admission: EM | Admit: 2016-10-23 | Discharge: 2016-10-23 | Disposition: A | Discharge status: Home / Self Care | Attending: Emergency Medicine | Admitting: Emergency Medicine

## 2016-10-23 DIAGNOSIS — S0081XA Abrasion of other part of head, initial encounter: Secondary | ICD-10-CM | POA: Insufficient documentation

## 2016-10-23 DIAGNOSIS — N183 Chronic kidney disease, stage 3 (moderate): Secondary | ICD-10-CM | POA: Diagnosis not present

## 2016-10-23 DIAGNOSIS — Y929 Unspecified place or not applicable: Secondary | ICD-10-CM | POA: Insufficient documentation

## 2016-10-23 DIAGNOSIS — E039 Hypothyroidism, unspecified: Secondary | ICD-10-CM | POA: Insufficient documentation

## 2016-10-23 DIAGNOSIS — S0990XA Unspecified injury of head, initial encounter: Secondary | ICD-10-CM

## 2016-10-23 DIAGNOSIS — Y999 Unspecified external cause status: Secondary | ICD-10-CM | POA: Insufficient documentation

## 2016-10-23 DIAGNOSIS — Z79899 Other long term (current) drug therapy: Secondary | ICD-10-CM | POA: Insufficient documentation

## 2016-10-23 DIAGNOSIS — Z87891 Personal history of nicotine dependence: Secondary | ICD-10-CM | POA: Diagnosis not present

## 2016-10-23 DIAGNOSIS — W19XXXA Unspecified fall, initial encounter: Secondary | ICD-10-CM

## 2016-10-23 DIAGNOSIS — I129 Hypertensive chronic kidney disease with stage 1 through stage 4 chronic kidney disease, or unspecified chronic kidney disease: Secondary | ICD-10-CM | POA: Diagnosis not present

## 2016-10-23 DIAGNOSIS — Y939 Activity, unspecified: Secondary | ICD-10-CM | POA: Diagnosis not present

## 2016-10-23 DIAGNOSIS — W050XXA Fall from non-moving wheelchair, initial encounter: Secondary | ICD-10-CM | POA: Insufficient documentation

## 2016-10-23 DIAGNOSIS — G309 Alzheimer's disease, unspecified: Secondary | ICD-10-CM | POA: Insufficient documentation

## 2016-10-23 DIAGNOSIS — T148XXA Other injury of unspecified body region, initial encounter: Secondary | ICD-10-CM

## 2016-10-23 NOTE — ED Notes (Signed)
Bed: WA09 Expected date:  Expected time:  Means of arrival:  Comments: 83 yo fall; eye injury

## 2016-10-23 NOTE — ED Notes (Addendum)
Attempted to call report to [facility]. Got voicemail. Left message.

## 2016-10-23 NOTE — ED Notes (Signed)
ptar called 

## 2016-10-23 NOTE — ED Provider Notes (Signed)
[facility]-EMERGENCY DEPT Provider Note   CSN: [id number] Arrival date & time: 10/23/16  7829     History   Chief Complaint Chief Complaint  Patient presents with  . Abrasion    right eye  . Fall  . Neck Pain    HPI ALTIN SEASE is a 83 y.o. male.  HPI   Level V Caveat, dementia  83 year old male with a history of Alzheimer's, hypertension, hyperlipidemia, on hospice presents with concern for fall from wheelchair. Patient reportedly leaned forward in his wheelchair, fell and struck his face on the bedside table. He had an abrasion above his right eye. With EMS he had reported neck pain and they placed him in a c-collar.  Patient sleepy on my exam, however when asked states "I do not have any pain anywhere."  His son thinks that he is at his baseline, and reports that at times at the facility he is sleepy and difficult to arouse. Patient received Xanax at 6 AM, however permission facility he was awake and alert prior to the fall.  They deny any vomiting, diarrhea, black or bloody stools, cough or other concerns.  Daughter in law feels he is sleepier than baseline at this time. Son and daughter-in-law at bedside, and reports that they focus on his care at this time his comfort.  Past Medical History:  Diagnosis Date  . Alzheimer disease   . CKD (chronic kidney disease) stage 3, GFR 30-59 ml/min   . Dementia   . GERD (gastroesophageal reflux disease)   . Hyperlipidemia   . Hypertension   . Kidney stones   . Osteoarthritis   . UTI (lower urinary tract infection)    frequent    Patient Active Problem List   Diagnosis Date Noted  . CAP (community acquired pneumonia) 09/07/2012  . Sepsis(995.91) 09/07/2012  . Acute encephalopathy 09/07/2012  . Thrombocytopenia, unspecified 09/07/2012  . Dementia 09/07/2012  . Unspecified hypothyroidism 09/07/2012  . Essential hypertension, benign 09/07/2012  . Right bundle branch block 09/07/2012    No past surgical history on  file.     Home Medications    Prior to Admission medications   Medication Sig Start Date End Date Taking? Authorizing Provider  acetaminophen (TYLENOL) 500 MG tablet Take 500 mg by mouth 3 (three) times daily.    Yes Historical Provider, MD  ALPRAZolam (XANAX) 0.25 MG tablet Take 0.25 mg by mouth 3 (three) times daily.    Yes Historical Provider, MD  hydrochlorothiazide (HYDRODIURIL) 12.5 MG tablet Take 12.5 mg by mouth daily. Give 1 tablet by mouth once every morning. HOLD for SBP <120 or HR <60   Yes Historical Provider, MD  hydroxypropyl methylcellulose / hypromellose (ISOPTO TEARS / GONIOVISC) 2.5 % ophthalmic solution Place 1 drop into both eyes every 4 (four) hours as needed for dry eyes (while awake).   Yes Historical Provider, MD  levothyroxine (SYNTHROID, LEVOTHROID) 100 MCG tablet Take 100 mcg by mouth daily.   Yes Historical Provider, MD  Melatonin 10 MG TABS Take 1 tablet by mouth at bedtime.   Yes Historical Provider, MD  memantine (NAMENDA) 10 MG tablet Take 10 mg by mouth 2 (two) times daily.   Yes Historical Provider, MD  potassium chloride (KLOR-CON) 20 MEQ packet Take 20 mEq by mouth daily. Take once daily, HOLD for SBP <120   Yes Historical Provider, MD  QUEtiapine (SEROQUEL) 25 MG tablet Take 25 mg by mouth at bedtime.   Yes Historical Provider, MD    Family  History No family history on file.  Social History Social History  Substance Use Topics  . Smoking status: Former Games developer  . Smokeless tobacco: Never Used  . Alcohol use No     Allergies   Codeine   Review of Systems Review of Systems  Respiratory: Negative for cough.   Gastrointestinal: Negative for blood in stool, diarrhea, nausea and vomiting.  Musculoskeletal: Negative for neck pain (denies at this time).  Skin: Positive for wound.  Neurological: Negative for headaches.     Physical Exam Updated Vital Signs BP 122/68   Pulse 87   Resp 18   SpO2 92%   Physical Exam  Constitutional:  He appears well-developed and well-nourished. He appears listless. No distress.  Sleepy, will answer occasional questions, will follow commands  HENT:  Head: Normocephalic. Head is with abrasion (superior to right eye). Head is without raccoon's eyes and without Battle's sign.  Right Ear: No hemotympanum.  Left Ear: No hemotympanum.  Eyes: EOM are normal. Left conjunctiva is injected.  Neck: No spinous process tenderness (does not appear to have tenderness but exam limited by mental status) present.  Cardiovascular: Normal rate, regular rhythm, normal heart sounds and intact distal pulses.  Exam reveals no gallop and no friction rub.   No murmur heard. Pulmonary/Chest: Effort normal and breath sounds normal. No respiratory distress. He has no wheezes. He has no rales.  Abdominal: Soft. He exhibits no distension. There is no tenderness. There is no guarding.  Musculoskeletal: He exhibits no edema.  Neurological: He appears listless. GCS eye subscore is 4. GCS verbal subscore is 5. GCS motor subscore is 6.  Symmetric upper ext strength Lower ext bilateral weakness (fam reports baseline/wheelchair bound) but able to raise both equally, Midline tongue and palate, no facial droop, pupils normal, does not respond regarding sensory questions  Skin: Skin is warm and dry. He is not diaphoretic.  Nursing note and vitals reviewed.    ED Treatments / Results  Labs (all labs ordered are listed, but only abnormal results are displayed) Labs Reviewed - No data to display  EKG  EKG Interpretation None       Radiology No results found.  Procedures Procedures (including critical care time)  Medications Ordered in ED Medications - No data to display   Initial Impression / Assessment and Plan / ED Course  I have reviewed the triage vital signs and the nursing notes.  Pertinent labs & imaging results that were available during my care of the patient were reviewed by me and considered in my  medical decision making (see chart for details).    83 year old male with a history of Alzheimer's, CKD, hypertension, hyperlipidemia, on hospice presents with concern for fall from wheelchair.  Patient had reportedly had neck pain with EMS. Son feels he exhibits sleepy behavior at his baseline, however daughter-in-law and facility report he has been more sleepy since the fall.  Do not feel infectious work up indicated given goals of care at this time, patient otherwise at baseline.  Discussed options with family including possible head CT and cervical spine CT.  However, we do not feel that imaging will alter his plan of care. Feel that patient is not a candidate for surgery, and that discovery of cervical fracture and necessity other questions regarding whether he should wear a cervical collar which could be potentially uncomfortable and for pressure ulcers. Son and daughter-in-law both report that comfort is her primary concern at this time.  Given imaging will not  change focus of care from comfort, decided to forego CT scan imaging. Discussed possibility that patient may have no injuries, however also discussed that he may have an intracranial bleed or fracture, and given goals of care, recommend continuing to work with hospice regarding providing comfort for patient. Discussed wound care for abrasion.   Final Clinical Impressions(s) / ED Diagnoses   Final diagnoses:  Injury of head, initial encounter  Abrasion  Fall, initial encounter    New Prescriptions New Prescriptions   No medications on file     Alvira Monday, MD 10/23/16 1039

## 2016-10-23 NOTE — ED Notes (Signed)
ED Provider at bedside. 

## 2016-10-23 NOTE — ED Triage Notes (Signed)
Pt presents today via [facility] from [facility] after suffering a fall from his wheelchair. He leaned forward in his wheelchair, fell, and struck his face on a bedside table. He is noticed to have an abrasion above the right eye. He complained of neck pain with EMS and was placed in a c-collar. Pt has hx of dementia and is followed by hospice. Hospice requested he be evaluated at hospital.

## 2016-12-17 DEATH — deceased

## 2017-08-12 IMAGING — CT CT HEAD W/O CM
3 of 5 series · 15 of 47 positions shown, 18 images · non-contrast
Comparison: Head CT dated 09/07/2010

CLINICAL DATA: [AGE] male with fall and posterior head
injury.

EXAM:
CT HEAD WITHOUT CONTRAST
TECHNIQUE: Contiguous axial images were obtained from the base of the skull
through the vertex without intravenous contrast.

[Series 4: head w/o · axial · non-contrast · 0.44mm/px · z∈[+813,+938]mm · 9 of 33 slices shown, 12 images]
[im 4/33  brain]
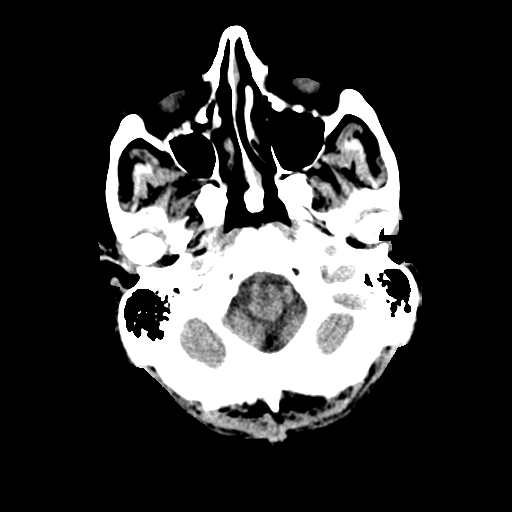
[im 4/33  bone]
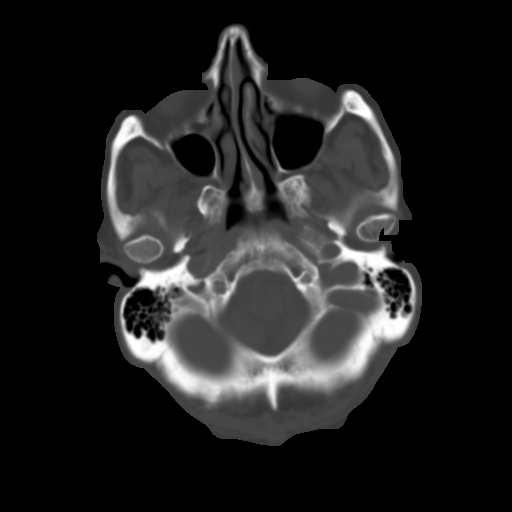
[im 7/33  brain]
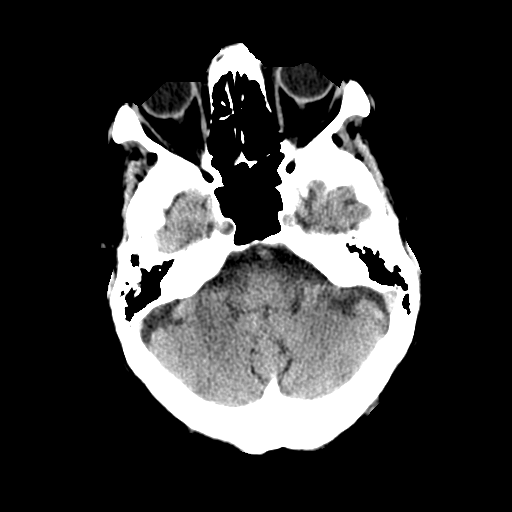
[im 10/33  brain]
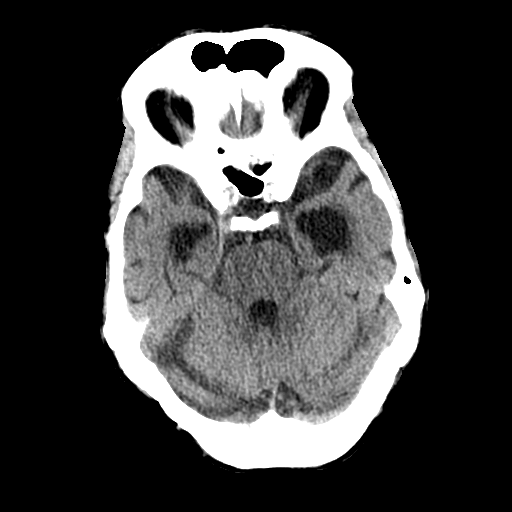
[im 13/33  brain]
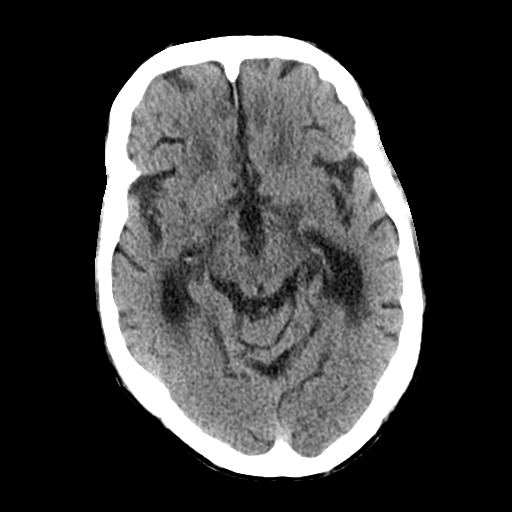
[im 17/33  brain]
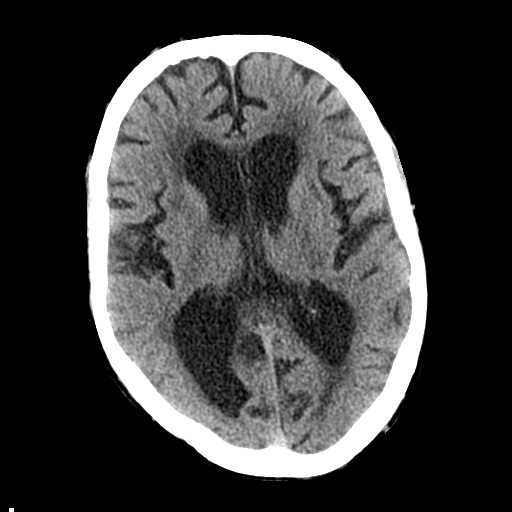
[im 17/33  bone]
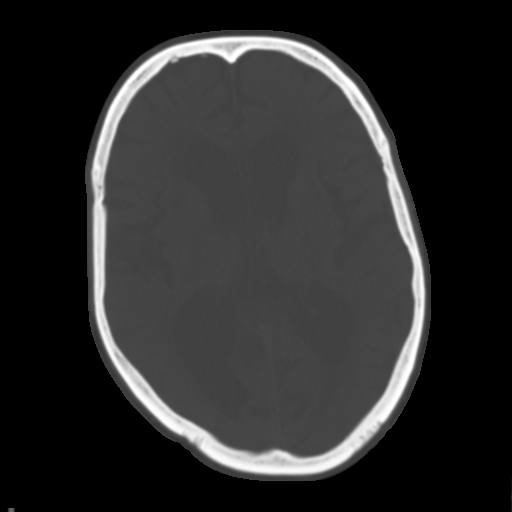
[im 20/33  brain]
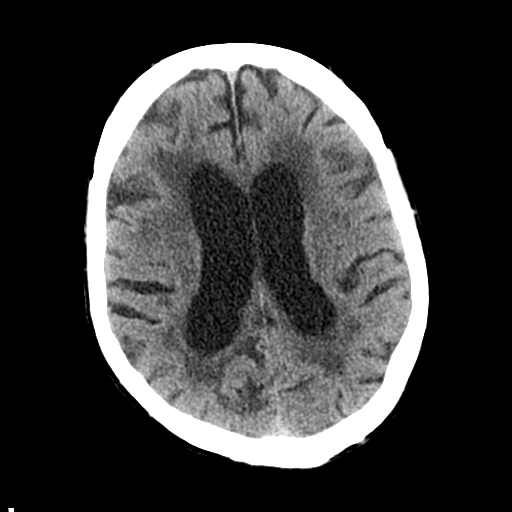
[im 23/33  brain]
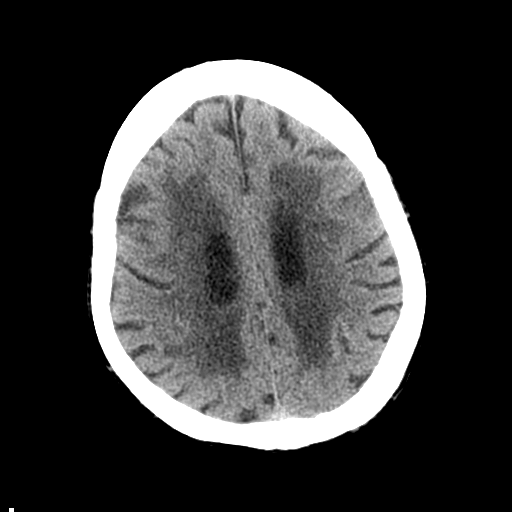
[im 26/33  brain]
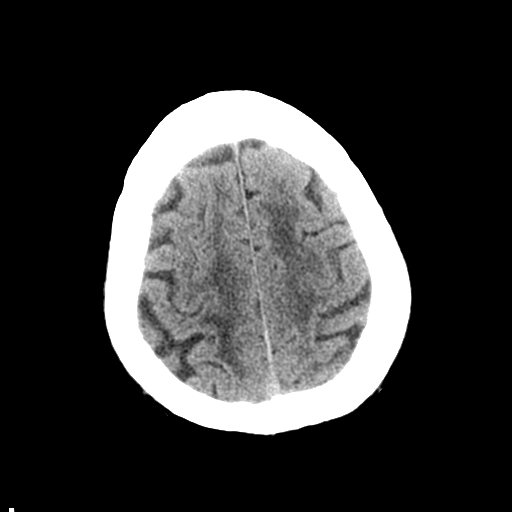
[im 29/33  brain]
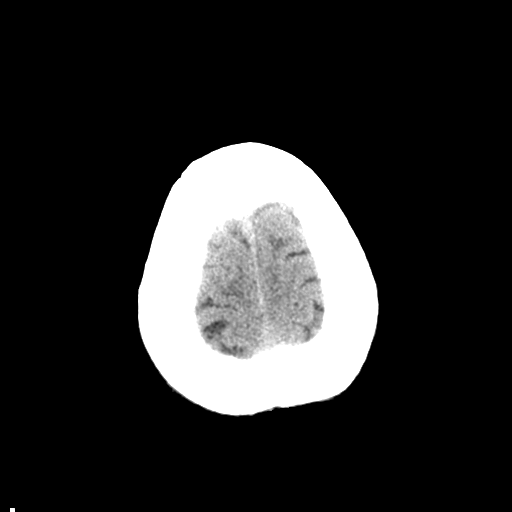
[im 29/33  bone]
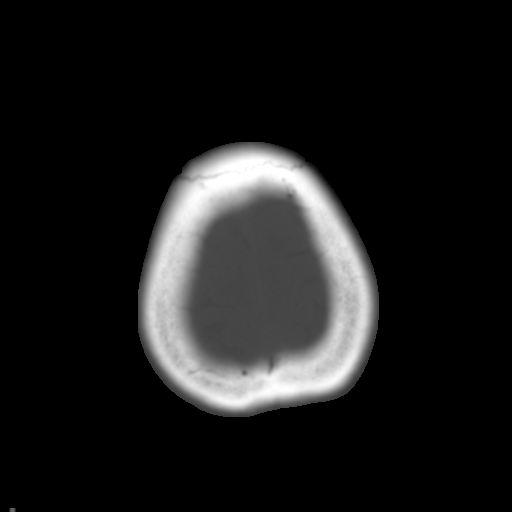

[Series 7: coronal · coronal · 0.32mm/px · 3 of 71 slices shown]
[im 24/71  brain]
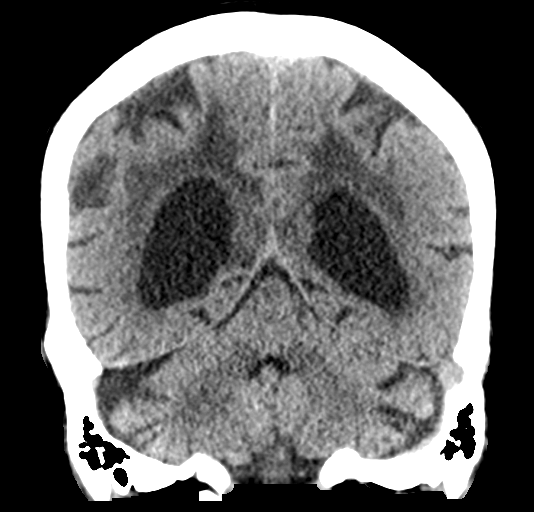
[im 32/71  brain]
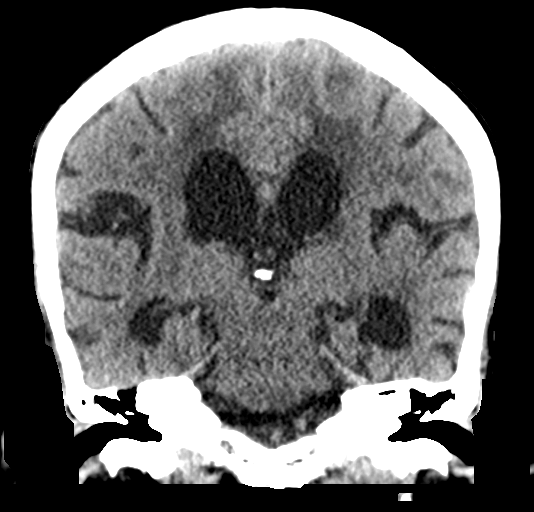
[im 39/71  brain]
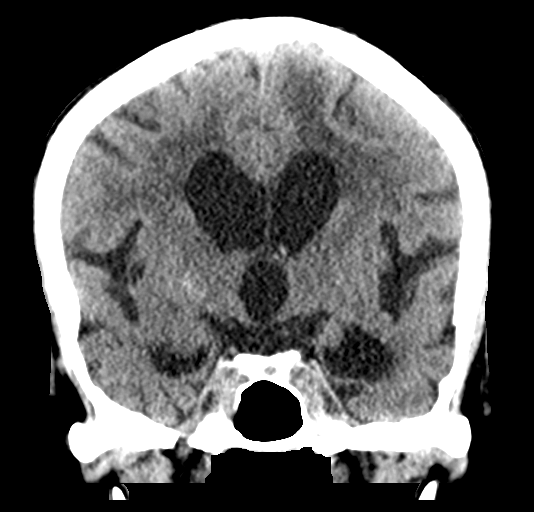

[Series 8: sagittal · sagittal · 0.34mm/px · 3 of 53 slices shown]
[im 18/53  brain]
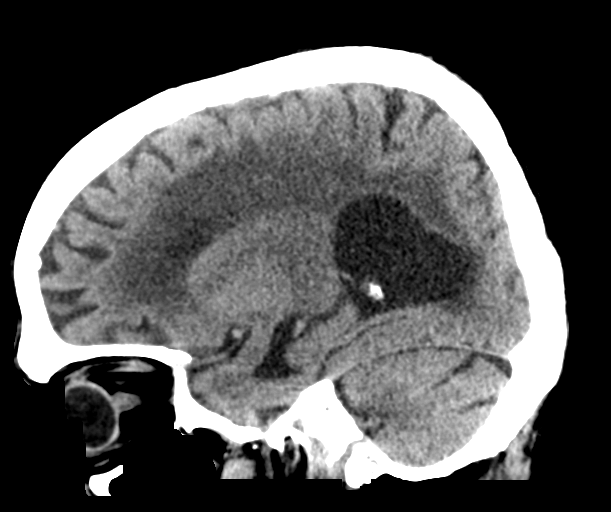
[im 27/53  brain]
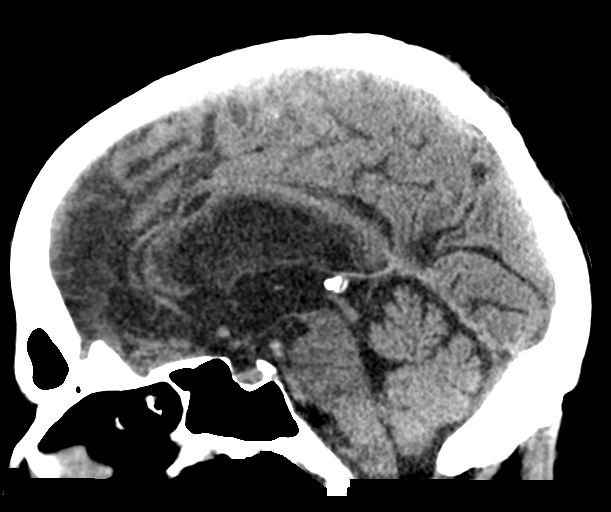
[im 35/53  brain]
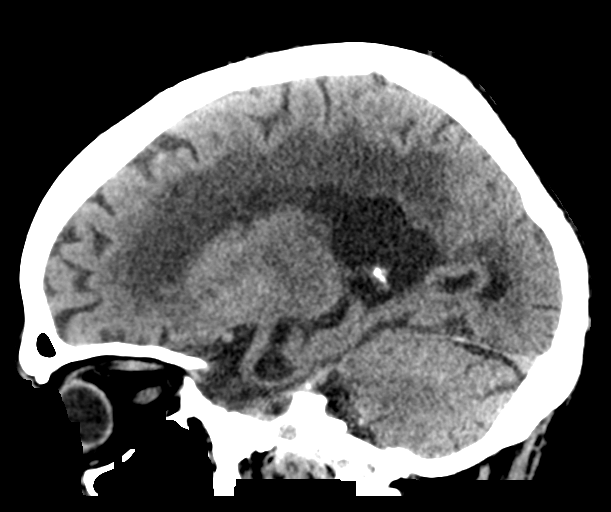

[15 of 47 positions shown; findings below may reference images not displayed]

FINDINGS: Brain: There is moderate age-related atrophy and chronic
microvascular ischemic changes. There is no acute intracranial
hemorrhage. No mass effect or midline shift noted. No extra-axial
fluid collections.

Vascular: No hyperdense vessel or unexpected calcification.

Skull: Normal. Negative for fracture or focal lesion.

Sinuses/Orbits: No acute finding.

Other: None
IMPRESSION: No acute intracranial hemorrhage.

Age-related atrophy and chronic microvascular ischemic disease.
